# Patient Record
Sex: Female | Born: 2000 | Race: White | Hispanic: No | Marital: Single | Smoking: Never smoker
Health system: Southern US, Community
[De-identification: ages and names within clinical notes are randomized; demographics above are authoritative.]

## PROBLEM LIST (undated history)

## (undated) DIAGNOSIS — K219 Gastro-esophageal reflux disease without esophagitis: Secondary | ICD-10-CM

## (undated) HISTORY — DX: Gastro-esophageal reflux disease without esophagitis: K21.9

---
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
## 2019-06-02 DIAGNOSIS — Z20828 Contact with and (suspected) exposure to other viral communicable diseases: Secondary | ICD-10-CM | POA: Diagnosis not present

## 2019-08-31 DIAGNOSIS — Z20828 Contact with and (suspected) exposure to other viral communicable diseases: Secondary | ICD-10-CM | POA: Diagnosis not present

## 2019-09-07 DIAGNOSIS — R079 Chest pain, unspecified: Secondary | ICD-10-CM | POA: Diagnosis not present

## 2019-09-07 DIAGNOSIS — B349 Viral infection, unspecified: Secondary | ICD-10-CM | POA: Diagnosis not present

## 2019-12-07 DIAGNOSIS — Z20828 Contact with and (suspected) exposure to other viral communicable diseases: Secondary | ICD-10-CM | POA: Diagnosis not present

## 2019-12-14 DIAGNOSIS — R1013 Epigastric pain: Secondary | ICD-10-CM | POA: Diagnosis not present

## 2019-12-14 DIAGNOSIS — R6250 Unspecified lack of expected normal physiological development in childhood: Secondary | ICD-10-CM | POA: Diagnosis not present

## 2019-12-14 DIAGNOSIS — K219 Gastro-esophageal reflux disease without esophagitis: Secondary | ICD-10-CM | POA: Diagnosis not present

## 2020-03-18 DIAGNOSIS — K219 Gastro-esophageal reflux disease without esophagitis: Secondary | ICD-10-CM | POA: Diagnosis not present

## 2020-04-18 DIAGNOSIS — K219 Gastro-esophageal reflux disease without esophagitis: Secondary | ICD-10-CM | POA: Diagnosis not present

## 2020-04-19 HISTORY — PX: OTHER SURGICAL HISTORY: SHX169

## 2020-04-21 ENCOUNTER — Other Ambulatory Visit: Payer: Self-pay

## 2020-04-21 ENCOUNTER — Ambulatory Visit (INDEPENDENT_AMBULATORY_CARE_PROVIDER_SITE_OTHER): Payer: BC Managed Care – PPO | Admitting: Family Medicine

## 2020-04-21 ENCOUNTER — Encounter: Payer: Self-pay | Admitting: Family Medicine

## 2020-04-21 VITALS — BP 100/60 | HR 62 | Temp 98.0°F | Ht 64.0 in | Wt 97.2 lb

## 2020-04-21 DIAGNOSIS — Z0001 Encounter for general adult medical examination with abnormal findings: Secondary | ICD-10-CM

## 2020-04-21 DIAGNOSIS — Z23 Encounter for immunization: Secondary | ICD-10-CM

## 2020-04-21 DIAGNOSIS — M25571 Pain in right ankle and joints of right foot: Secondary | ICD-10-CM

## 2020-04-21 DIAGNOSIS — Z0000 Encounter for general adult medical examination without abnormal findings: Secondary | ICD-10-CM

## 2020-04-21 DIAGNOSIS — D229 Melanocytic nevi, unspecified: Secondary | ICD-10-CM | POA: Diagnosis not present

## 2020-04-21 DIAGNOSIS — M357 Hypermobility syndrome: Secondary | ICD-10-CM | POA: Insufficient documentation

## 2020-04-21 DIAGNOSIS — Q796 Ehlers-Danlos syndrome, unspecified: Secondary | ICD-10-CM

## 2020-04-21 DIAGNOSIS — F819 Developmental disorder of scholastic skills, unspecified: Secondary | ICD-10-CM | POA: Insufficient documentation

## 2020-04-21 LAB — CBC WITH DIFFERENTIAL/PLATELET
Basophils Absolute: 0 10*3/uL (ref 0.0–0.1)
Basophils Relative: 0.6 % (ref 0.0–3.0)
Eosinophils Absolute: 0 10*3/uL (ref 0.0–0.7)
Eosinophils Relative: 0.3 % (ref 0.0–5.0)
HCT: 41.2 % (ref 36.0–46.0)
Hemoglobin: 14.2 g/dL (ref 12.0–15.0)
Lymphocytes Relative: 20.9 % (ref 12.0–46.0)
Lymphs Abs: 1.7 10*3/uL (ref 0.7–4.0)
MCHC: 34.5 g/dL (ref 30.0–36.0)
MCV: 100.4 fl — ABNORMAL HIGH (ref 78.0–100.0)
Monocytes Absolute: 0.5 10*3/uL (ref 0.1–1.0)
Monocytes Relative: 6.8 % (ref 3.0–12.0)
Neutro Abs: 5.7 10*3/uL (ref 1.4–7.7)
Neutrophils Relative %: 71.4 % (ref 43.0–77.0)
Platelets: 310 10*3/uL (ref 150.0–400.0)
RBC: 4.1 Mil/uL (ref 3.87–5.11)
RDW: 13.3 % (ref 11.5–15.5)
WBC: 7.9 10*3/uL (ref 4.0–10.5)

## 2020-04-21 LAB — LIPID PANEL
Cholesterol: 128 mg/dL (ref 0–200)
HDL: 60.1 mg/dL (ref >39.00)
LDL Cholesterol: 53 mg/dL (ref 0–99)
NonHDL: 67.89
Total CHOL/HDL Ratio: 2
Triglycerides: 74 mg/dL (ref 0.0–149.0)
VLDL: 14.8 mg/dL (ref 0.0–40.0)

## 2020-04-21 LAB — COMPREHENSIVE METABOLIC PANEL
ALT: 8 U/L (ref 0–35)
AST: 10 U/L (ref 0–37)
Albumin: 4.8 g/dL (ref 3.5–5.2)
Alkaline Phosphatase: 59 U/L (ref 39–117)
BUN: 6 mg/dL (ref 6–23)
CO2: 30 mEq/L (ref 19–32)
Calcium: 10 mg/dL (ref 8.4–10.5)
Chloride: 101 mEq/L (ref 96–112)
Creatinine, Ser: 0.51 mg/dL (ref 0.40–1.20)
GFR: 151.86 mL/min (ref >60.00)
Glucose, Bld: 80 mg/dL (ref 70–99)
Potassium: 4.2 mEq/L (ref 3.5–5.1)
Sodium: 135 mEq/L (ref 135–145)
Total Bilirubin: 0.6 mg/dL (ref 0.2–1.2)
Total Protein: 7 g/dL (ref 6.0–8.3)

## 2020-04-21 LAB — VITAMIN D 25 HYDROXY (VIT D DEFICIENCY, FRACTURES): VITD: 25.52 ng/mL — ABNORMAL LOW (ref 30.00–100.00)

## 2020-04-21 LAB — TSH: TSH: 0.63 u[IU]/mL (ref 0.35–4.50)

## 2020-04-21 NOTE — Patient Instructions (Signed)
-I want her to get eyes checked -will likely order echo of heart, but want her to see sports med first.  -email me when you have appointment with sports med so I can send message -let GI doc know about this because can have GI symptoms associated with it as well.   Ehlers-Danlos Syndrome Ehlers-Danlos syndrome (EDS) is a group of genetic disorders that affect the body's connective tissues, which are made up of proteins. Connective tissues give support and elasticity to your skin, bones, joints, blood vessels, and internal organs. EDS causes connective tissues to become weak, fragile, and stretchable. People with EDS often have overly flexible joints and stretchy, fragile skin. There are 13 types of EDS, but many of those are rare. The most common types affect joints and skin. Other types affect the eyes, spine, or heart valves. The most severe types affect the walls of blood vessels and the structures of internal organs. What are the causes? This condition is caused by abnormal genes (genetic defects). Some types are passed down from parent to child (inherited) and others are not. Sometimes, the genes can become defective during development in the womb. What are the signs or symptoms? The signs and symptoms of EDS depend on the type. Signs and symptoms may start in childhood or later in life. Signs and symptoms of the common types include: Joint and muscle symptoms  Overly flexible joints that have a large range of motion and can dislocate easily.  Joint pain.  Weak, floppy muscles. Skin symptoms  Velvety, soft skin that stretches and sags.  Skin that bruises and tears easily.  Skin tears that are hard to repair with stitches (sutures) and heal with wide scars.  Lumps under the skin. Vision symptoms  Rupture of the eyeball or tearing of the tissue inside the eye (retinal detachment).  Changes in vision, including seeing floaters, flashes, or having sudden loss of vision. Vascular  symptoms  Bleeding from tears or ruptures of internal organs or blood vessels.  Weak heart valves causing weakening of the heart. Orthopedic symptoms  Curved spine, which can cause difficulty breathing.  Bowed limbs and short stature.  Muscle or tendon tears.  Weak, thin bones.  Club foot. Other symptoms  Collapsed lung.  Unusual facial features, such as a thin nose and lips, sunken cheeks, small ear lobes, and prominent eyes. How is this diagnosed? Your health care provider may suspect EDS based on your symptoms and medical history, especially if EDS runs in your family. Your health care provider will also do a physical exam. You may also have diagnostic tests including:  Blood tests to check for genetic defects.  Tissue tests to check for abnormal body proteins (collagen).  X-rays of the spine.  Imaging studies of internal organs (MRI or CT scan).  Bone scan.  Heart ultrasound (echocardiogram) to look for heart valve problems. How is this treated? There is no cure for this condition, but treatment can help symptoms and prevent complications. Treatment depends on the type of EDS and the symptoms it causes. Treatment options can include:  Over-the-counter pain medicines.  Vitamin C to strengthen skin.  Vitamin D and calcium to strengthen bones.  Blood pressure medicines to prevent blood vessel tears or ruptures.  Physical therapy to strengthen muscles and joints.  Assistive devices that help you move, like braces or a wheelchair, walker, or scooter.  Surgery to repair wounds, tears, joints, or ruptures. Follow these instructions at home:   Take over-the-counter and prescription medicines  only as told by your health care provider.  Return to your normal activities as told by your health care provider. Ask your health care provider what activities are safe for you. It may be important to avoid contact sports and heavy lifting.  Let your health care provider  know if you become pregnant. You will need special care to prevent rupture of the womb.  Let all health care providers know that you have EDS before any surgery or procedure.  Wear a medical alert bracelet.  Schedule eye exams often.  Consider having genetic counseling to learn if you may pass EDS on to your children. Contact a health care provider if:  You have joint pain.  You cut or tear your skin.  You have weakness or shortness of breath.  You have any new symptoms. Get help right away if you have:  Any sudden and severe pain.  Trouble breathing.  Chest pain.  Floaters or flashes in your vision or sudden loss of vision. Summary  Ehlers-Danlos syndrome (EDS) is a group of genetic disorders that affect the connective tissues of the body.  EDS is caused by abnormal genes (genetic defects).  The effects of EDS can range from having overly flexible joints and fragile skin to very serious complications, such as blood vessel or organ ruptures.  There is no cure for this condition, but treatment can relieve some symptoms and prevent complications. This information is not intended to replace advice given to you by your health care provider. Make sure you discuss any questions you have with your health care provider. Document Revised: 06/22/2019 Document Reviewed: 06/22/2019 Elsevier Patient Education  [place] 25-14 Years Old, Female Preventive care refers to lifestyle choices and visits with your health care provider that can promote health and wellness. At this stage in your life, you may start seeing a primary care physician instead of a pediatrician. Your health care is now your responsibility. Preventive care for young adults includes:  A yearly physical exam. This is also called an annual wellness visit.  Regular dental and eye exams.  Immunizations.  Screening for certain conditions.  Healthy lifestyle choices, such as diet and  exercise. What can I expect for my preventive care visit? Physical exam Your health care provider may check:  Height and weight. These may be used to calculate body mass index (BMI), which is a measurement that tells if you are at a healthy weight.  Heart rate and blood pressure.  Body temperature. Counseling Your health care provider may ask you questions about:  Past medical problems and family medical history.  Alcohol, tobacco, and drug use.  Home and relationship well-being.  Access to firearms.  Emotional well-being.  Diet, exercise, and sleep habits.  Sexual activity and sexual health.  Method of birth control.  Menstrual cycle.  Pregnancy history. What immunizations do I need?  Influenza (flu) vaccine  This is recommended every year. Tetanus, diphtheria, and pertussis (Tdap) vaccine  You may need a Td booster every 10 years. Varicella (chickenpox) vaccine  You may need this vaccine if you have not already been vaccinated. Human papillomavirus (HPV) vaccine  If recommended by your health care provider, you may need three doses over 6 months. Measles, mumps, and rubella (MMR) vaccine  You may need at least one dose of MMR. You may also need a second dose. Meningococcal conjugate (MenACWY) vaccine  One dose is recommended if you are 22-65 years old and a [employer]vertising account pl[employer]  college student living in a residence hall, or if you have one of several medical conditions. You may also need additional booster doses. Pneumococcal conjugate (PCV13) vaccine  You may need this if you have certain conditions and were not previously vaccinated. Pneumococcal polysaccharide (PPSV23) vaccine  You may need one or two doses if you smoke cigarettes or if you have certain conditions. Hepatitis A vaccine  You may need this if you have certain conditions or if you travel or work in places where you may be exposed to hepatitis A. Hepatitis B vaccine  You may need this if you have  certain conditions or if you travel or work in places where you may be exposed to hepatitis B. Haemophilus influenzae type b (Hib) vaccine  You may need this if you have certain risk factors. You may receive vaccines as individual doses or as more than one vaccine together in one shot (combination vaccines). Talk with your health care provider about the risks and benefits of combination vaccines. What tests do I need? Blood tests  Lipid and cholesterol levels. These may be checked every 5 years starting at age 78.  Hepatitis C test.  Hepatitis B test. Screening  Pelvic exam and Pap test. This may be done every 3 years starting at age 68.  Sexually transmitted disease (STD) testing, if you are at risk.  BRCA-related cancer screening. This may be done if you have a family history of breast, ovarian, tubal, or peritoneal cancers. Other tests  Tuberculosis skin test.  Vision and hearing tests.  Skin exam.  Breast exam. Follow these instructions at home: Eating and drinking   Eat a diet that includes fresh fruits and vegetables, whole grains, lean protein, and low-fat dairy products.  Drink enough fluid to keep your urine pale yellow.  Do not drink alcohol if: ? Your health care provider tells you not to drink. ? You are pregnant, may be pregnant, or are planning to become pregnant. ? You are under the legal drinking age. In the [place] the legal drinking age is 55.  If you drink alcohol: ? Limit how much you have to 0-1 drink a day. ? Be aware of how much alcohol is in your drink. In the [place] one drink equals one 12 oz bottle of beer (355 mL), one 5 oz glass of wine (148 mL), or one 1 oz glass of hard liquor (44 mL). Lifestyle  Take daily care of your teeth and gums.  Stay active. Exercise at least 30 minutes 5 or more days of the week.  Do not use any products that contain nicotine or tobacco, such as cigarettes, e-cigarettes, and chewing tobacco. If you need help  quitting, ask your health care provider.  Do not use drugs.  If you are sexually active, practice safe sex. Use a condom or other form of birth control (contraception) in order to prevent pregnancy and STIs (sexually transmitted infections). If you plan to become pregnant, see your health care provider for a pre-conception visit.  Find healthy ways to cope with stress, such as: ? Meditation, yoga, or listening to music. ? Journaling. ? Talking to a trusted person. ? Spending time with friends and family. Safety  Always wear your seat belt while driving or riding in a vehicle.  Do not drive if you have been drinking alcohol. Do not ride with someone who has been drinking.  Do not drive when you are tired or distracted. Do not text while driving.  Wear a helmet  and other protective equipment during sports activities.  If you have firearms in your house, make sure you follow all gun safety procedures.  Seek help if you have been bullied, physically abused, or sexually abused.  Use the Internet responsibly to avoid dangers such as online bullying and online sex predators. What's next?  Go to your health care provider once a year for a well check visit.  Ask your health care provider how often you should have your eyes and teeth checked.  Stay up to date on all vaccines. This information is not intended to replace advice given to you by your health care provider. Make sure you discuss any questions you have with your health care provider. Document Revised: 11/13/2018 Document Reviewed: 11/13/2018 Elsevier Patient Education  2020 [employer].

## 2020-04-21 NOTE — Progress Notes (Signed)
Patient: Kelly Warren MRN: [id number] DOB: 2001/10/05 PCP: Orma Flaming, MD     Subjective:  Chief Complaint  Patient presents with  . Establish Care    New Patient    HPI: The patient is a 19 y.o. female who presents today for annual exam. She denies any changes to past medical history. There have been no recent hospitalizations. They are not following a well balanced diet and exercise plan. Weight has been stable. She complains of right lower pain on the top of foot, where she bends. Starting 2 weeks ago. Her Mother is present in the visit, and requests Meningitis vaccine.   Pain in right ankle. Started 2 weeks ago. Denies any trauma, falls, exercise, new shoes. She states it just started hurting. Only hurts when walking. No edema/redness.   Immunization History  Administered Date(s) Administered  . Meningococcal Mcv4o 04/21/2020  . PFIZER SARS-COV-2 Vaccination 02/15/2020, 03/08/2020   Colonoscopy: routine screening Mammogram: routine screening  Pap smear: n/a   Review of Systems  Respiratory: Negative for cough, shortness of breath and wheezing.   Cardiovascular: Negative for chest pain and palpitations.  Gastrointestinal: Negative for abdominal pain, nausea and vomiting.  Genitourinary: Negative for frequency, pelvic pain and urgency.  Neurological: Negative for dizziness, light-headedness and headaches.    Allergies Patient has No Known Allergies.  Past Medical History Patient  has a past medical history of GERD (gastroesophageal reflux disease).  Surgical History Patient  has a past surgical history that includes wisdom teeth (04/19/2020).  Family History Pateint's family history includes Arthritis in her maternal grandfather, maternal grandmother, and paternal grandfather; Diabetes in her maternal grandfather and maternal grandmother; Hyperlipidemia in her maternal grandfather and maternal grandmother; Hypertension in her maternal grandfather and maternal  grandmother; Miscarriages / [place] in her sister.  Social History Patient  reports that she has never smoked. She has never used smokeless tobacco. She reports that she does not drink alcohol or use drugs.    Objective: Vitals:   04/21/20 1058  BP: 100/60  Pulse: 62  Temp: 98 F (36.7 C)  TempSrc: Temporal  SpO2: 99%  Weight: 97 lb 3.2 oz (44.1 kg)  Height: 5\' 4"  (1.626 m)    Body mass index is 16.68 kg/m.  Physical Exam Vitals reviewed.  Constitutional:      Appearance: Normal appearance. She is well-developed.     Comments: Thin with poor posture and kyphosis   HENT:     Head: Normocephalic and atraumatic.     Right Ear: Tympanic membrane, ear canal and external ear normal.     Left Ear: Tympanic membrane, ear canal and external ear normal.     Nose: Nose normal.     Mouth/Throat:     Mouth: Mucous membranes are moist.     Comments: Can not see well. Just had wisdom tooth surgery  Eyes:     Extraocular Movements: Extraocular movements intact.     Conjunctiva/sclera: Conjunctivae normal.     Pupils: Pupils are equal, round, and reactive to light.  Neck:     Thyroid: No thyromegaly.  Cardiovascular:     Rate and Rhythm: Normal rate and regular rhythm.     Pulses: Normal pulses.     Heart sounds: Normal heart sounds. No murmur.  Pulmonary:     Effort: Pulmonary effort is normal.     Breath sounds: Normal breath sounds.  Abdominal:     General: Abdomen is flat. Bowel sounds are normal. There is no distension.  Palpations: Abdomen is soft.     Tenderness: There is no abdominal tenderness.  Musculoskeletal:     Cervical back: Normal range of motion and neck supple.     Comments: Lax and hypermobile joints.   Lymphadenopathy:     Cervical: No cervical adenopathy.  Skin:    General: Skin is warm and dry.     Capillary Refill: Capillary refill takes less than 2 seconds.     Findings: No rash.  Neurological:     General: No focal deficit present.      Mental Status: She is alert and oriented to person, place, and time.     Cranial Nerves: No cranial nerve deficit.     Coordination: Coordination normal.     Deep Tendon Reflexes: Reflexes normal.  Psychiatric:        Mood and Affect: Mood normal.        Behavior: Behavior normal.      Office Visit from 04/21/2020 in [place]  PHQ-2 Total Score  0          Assessment/plan: 1. Annual physical exam Routine labs today. Meningitis shot. Due for tdap in December and they will come back for this. Needs to start exercising/working on core. Poor posture. No need for pap/hiv-not sexually active.  Patient counseling [x]    Nutrition: Stressed importance of moderation in sodium/caffeine intake, saturated fat and cholesterol, caloric balance, sufficient intake of fresh fruits, vegetables, fiber, calcium, iron, and 1 mg of folate supplement per day (for females capable of pregnancy).  [x]    Stressed the importance of regular exercise.   []    Substance Abuse: Discussed cessation/primary prevention of tobacco, alcohol, or other drug use; driving or other dangerous activities under the influence; availability of treatment for abuse.   [x]    Injury prevention: Discussed safety belts, safety helmets, smoke detector, smoking near bedding or upholstery.   [x]    Sexuality: Discussed sexually transmitted diseases, partner selection, use of condoms, avoidance of unintended pregnancy  and contraceptive alternatives.  [x]    Dental health: Discussed importance of regular tooth brushing, flossing, and dental visits.  [x]    Health maintenance and immunizations reviewed. Please refer to Health maintenance section.    - CBC with Differential/Platelet - Comprehensive metabolic panel - Lipid panel - TSH - VITAMIN D 25 Hydroxy (Vit-D Deficiency, Fractures)  2. Acute right ankle pain See below. Will have her evaluated by sports med and hopefully they can do beighton score and measure arm  span-height . Concern related to EDS. See below.  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 She is also have GI symptoms that are consistent with this.  +generalized joint hypermobility +MSK pain -Will have sports eval and score her  -referral to genetics -eye exam and discussed with mom -echo ordered  4. Atypical mole  -they have derm and will have him look at this.   This visit occurred during the SARS-CoV-2 public health emergency.  Safety protocols were in place, including screening questions prior to the visit, additional usage of staff PPE, and extensive cleaning of exam room while observing appropriate contact time as indicated for disinfecting solutions.      Return if symptoms worsen or fail to improve.     Orma Flaming, MD [place]  04/21/2020

## 2020-04-22 ENCOUNTER — Other Ambulatory Visit: Payer: Self-pay | Admitting: Family Medicine

## 2020-04-22 ENCOUNTER — Encounter: Payer: Self-pay | Admitting: Family Medicine

## 2020-04-22 DIAGNOSIS — E559 Vitamin D deficiency, unspecified: Secondary | ICD-10-CM | POA: Insufficient documentation

## 2020-04-22 MED ORDER — VITAMIN D (ERGOCALCIFEROL) 1.25 MG (50000 UNIT) PO CAPS
ORAL_CAPSULE | ORAL | 0 refills | Status: DC
Start: 2020-04-22 — End: 2024-03-30

## 2020-04-22 NOTE — Progress Notes (Signed)
Called pt's Mother to give lab results. She verbalized understanding and verified pharmacy. [facility].

## 2020-04-22 NOTE — Progress Notes (Signed)
Please see what pharmacy they prefer that I send vitamin D please! Thanks,  Dr. Rogers Blocker

## 2020-04-28 ENCOUNTER — Ambulatory Visit: Payer: Self-pay

## 2020-04-28 ENCOUNTER — Encounter: Payer: Self-pay | Admitting: Family Medicine

## 2020-04-28 ENCOUNTER — Ambulatory Visit (INDEPENDENT_AMBULATORY_CARE_PROVIDER_SITE_OTHER): Payer: BC Managed Care – PPO | Admitting: Family Medicine

## 2020-04-28 ENCOUNTER — Ambulatory Visit (INDEPENDENT_AMBULATORY_CARE_PROVIDER_SITE_OTHER): Payer: BC Managed Care – PPO

## 2020-04-28 ENCOUNTER — Other Ambulatory Visit: Payer: Self-pay

## 2020-04-28 VITALS — BP 100/58 | HR 87 | Ht 64.0 in | Wt 95.2 lb

## 2020-04-28 DIAGNOSIS — M25571 Pain in right ankle and joints of right foot: Secondary | ICD-10-CM | POA: Diagnosis not present

## 2020-04-28 DIAGNOSIS — G8929 Other chronic pain: Secondary | ICD-10-CM

## 2020-04-28 DIAGNOSIS — M5385 Other specified dorsopathies, thoracolumbar region: Secondary | ICD-10-CM | POA: Diagnosis not present

## 2020-04-28 DIAGNOSIS — M546 Pain in thoracic spine: Secondary | ICD-10-CM

## 2020-04-28 DIAGNOSIS — R29898 Other symptoms and signs involving the musculoskeletal system: Secondary | ICD-10-CM

## 2020-04-28 NOTE — Progress Notes (Signed)
Subjective:    I'm seeing this patient as a consultation for:  Dr. Rogers Blocker. Note will be routed back to referring provider/PCP.  CC: R ankle pain and generalized hypermobility  I, Molly Weber, LAT, ATC, am serving as scribe for Dr. Lynne Leader.  HPI: Pt is a 19 y/o female presenting w/ c/o R anterior ankle pain x 2 weeks w/ no MOI.  She locates her pain to her R anterior ankle and distal lower leg.  She rates her pain as mild and describes her pain as sharp and aching.  Of more concern is overall joint laxity and desire to be screened for possible EDS.    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 Gaynell Face.  She was thought to have ligamentous laxity and did pretty well with physical therapy occupational therapy and speech therapy.  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  She is working part-time currently [employer].  She is never had a formal diagnosis.  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  Echocardiogram pending and was referred to genetics.   R ankle swelling: No R ankle mechanical symptoms: No Aggravating factors: walking; R ankle DF/PF; manual pressure to the area Treatments tried: IBU, ice  Past medical history, Surgical history, Family history, Social history, Allergies, and medications have been entered into the medical record, reviewed.   Review of Systems: No new headache, visual changes, nausea, vomiting, diarrhea, constipation, dizziness, abdominal pain, skin rash, fevers, chills, night sweats, weight loss, swollen lymph nodes, body aches, joint swelling, muscle aches, chest pain, shortness of breath, mood changes, visual or auditory hallucinations.   Objective:    Vitals:   04/28/20 0922  BP: (!) 100/58  Pulse: 87  SpO2: 97%   General: Well Developed, well nourished, and in no acute  distress.  Neuro/Psych: Alert and oriented x3, extra-ocular muscles intact, able to move all 4 extremities, sensation grossly intact. Skin: Warm and dry, no rashes noted.  No significant laxity. Respiratory: Not using accessory muscles, speaking in full sentences, trachea midline.  Cardiovascular: Pulses palpable, no extremity edema. Abdomen: Does not appear distended. MSK: Spine: Thoracic kyphosis present otherwise normal-appearing nontender normal motion. Lower extremities somewhat internally rotated lower legs with knee valgus.  Foot pronation present with standing however intact heel valgus with toe standing. Abnormal gait present. Excessive bilateral ankle laxity present to talar tilt.  Normal anterior drawer test.  However normal strength and motion otherwise.  Beighton score 2/9  Lab and Radiology Results  X-ray images obtained today personally and independently reviewed.  Thoracolumbar spine: Thoracic kyphosis and lumbar lordosis present.  Scoliosis present at lumbar spine convex to left with slight rotational component.  Not much scoliosis present thoracic spine. 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 Impression: Ankle joint ultrasound examination.   Impression and Recommendations:    Assessment and Plan: 19 y.o. 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  She does not have significant ligamentous laxity on my exam today with a Beighton score of 2/9.  Erler's Danlos is somewhat less likely.  However I do think  that she probably has some other neurodevelopmental syndrome or diagnosis.  I agree with genetics referral as well  as ophthalmology referral and echocardiogram.  These are reasonable to perform.  I did however notice that she has thoracic kyphosis on exam and on x-ray has a bit of scoliosis and thoracic kyphosis.  Plan to proceed with physical therapy for that as well as her ankle instability.  Additionally will use scaphoid pads or sports insoles to improve her pronation.  Lengthy discussion with patient and mother.  Recheck with me as needed..   Orders Placed This Encounter  Procedures  . [place] LIMITED JOINT SPACE STRUCTURES LOW RIGHT(NO LINKED CHARGES)    Order Specific Question:   Reason for Exam (SYMPTOM  OR DIAGNOSIS REQUIRED)    Answer:   R ankle pain    Order Specific Question:   Preferred imaging location?    Answer:   [place]  . 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    Answer:   No    Order Specific Question:   Preferred imaging location?    Answer:   [facility]    Order Specific Question:   Radiology Contrast Protocol - do NOT remove file path    Answer:   \\[email or web address]\DXFluoroContrastProtocols.pdf  . DG Ankle Complete Right    Standing Status:   Future    Number of Occurrences:   1    Standing Expiration Date:   04/28/2021    Order Specific Question:   Reason for Exam (SYMPTOM  OR DIAGNOSIS REQUIRED)    Answer:   eval ankle pain    Order Specific Question:   Is patient pregnant?    Answer:   No    Order Specific Question:   Preferred imaging location?    Answer:   [facility]    Order Specific Question:   Radiology Contrast Protocol - do NOT remove file path    Answer:    \\[email or web address]\Radiant\DXFluoroContrastProtocols.pdf  . Ambulatory referral to Physical Therapy    Referral Priority:   Routine    Referral Type:   Physical Medicine    Referral Reason:   Specialty Services Required    Requested Specialty:   Physical Therapy   No orders of the defined types were placed in this encounter.   Discussed warning signs or symptoms. Please see discharge instructions. Patient expresses understanding.   The above documentation has been reviewed and is accurate and complete Lynne Leader, M.D.

## 2020-04-28 NOTE — Patient Instructions (Addendum)
Thank you for coming in today. Plan for xrays today.  I will get results to you today or tomorrow.  Plan for PT.  Keep me updated.  Can do more remotely.  Will also recommend good arch support.  Try Hapad scaphoid pads or aftermarket insoles.

## 2020-04-28 NOTE — Progress Notes (Signed)
Radiology saw the same thing I did.  The thoracic spine does curve a bit and there is mild scoliosis present in the lumbar spine.  Physical therapy should be helpful for for this.

## 2020-04-28 NOTE — Progress Notes (Signed)
Ankle x-ray normal per radiology

## 2020-05-09 ENCOUNTER — Ambulatory Visit (INDEPENDENT_AMBULATORY_CARE_PROVIDER_SITE_OTHER): Payer: BC Managed Care – PPO | Admitting: Physical Therapy

## 2020-05-09 ENCOUNTER — Encounter: Payer: Self-pay | Admitting: Physical Therapy

## 2020-05-09 ENCOUNTER — Other Ambulatory Visit: Payer: Self-pay

## 2020-05-09 DIAGNOSIS — M25571 Pain in right ankle and joints of right foot: Secondary | ICD-10-CM | POA: Diagnosis not present

## 2020-05-09 DIAGNOSIS — R293 Abnormal posture: Secondary | ICD-10-CM | POA: Diagnosis not present

## 2020-05-09 DIAGNOSIS — R2689 Other abnormalities of gait and mobility: Secondary | ICD-10-CM

## 2020-05-12 ENCOUNTER — Other Ambulatory Visit: Payer: Self-pay | Admitting: Gastroenterology

## 2020-05-12 DIAGNOSIS — R634 Abnormal weight loss: Secondary | ICD-10-CM | POA: Diagnosis not present

## 2020-05-12 DIAGNOSIS — R110 Nausea: Secondary | ICD-10-CM

## 2020-05-12 DIAGNOSIS — R1013 Epigastric pain: Secondary | ICD-10-CM | POA: Diagnosis not present

## 2020-05-12 DIAGNOSIS — Z20822 Contact with and (suspected) exposure to covid-19: Secondary | ICD-10-CM | POA: Diagnosis not present

## 2020-05-12 DIAGNOSIS — K219 Gastro-esophageal reflux disease without esophagitis: Secondary | ICD-10-CM | POA: Diagnosis not present

## 2020-05-13 ENCOUNTER — Encounter: Payer: Self-pay | Admitting: Family Medicine

## 2020-05-13 ENCOUNTER — Other Ambulatory Visit: Payer: Self-pay | Admitting: Family Medicine

## 2020-05-13 DIAGNOSIS — Q796 Ehlers-Danlos syndrome, unspecified: Secondary | ICD-10-CM

## 2020-05-16 ENCOUNTER — Encounter: Payer: BC Managed Care – PPO | Admitting: Physical Therapy

## 2020-05-16 ENCOUNTER — Encounter: Payer: Self-pay | Admitting: Physical Therapy

## 2020-05-16 NOTE — Therapy (Signed)
[facility] [address] [place], [place], [address] Phone: [phone]   Fax:  [phone]  Physical Therapy Evaluation  Patient Details  Name: Kelly Warren MRN: [id number] Date of Birth: Dec 22, 2000 Referring Provider (PT): Lynne Leader   Encounter Date: 05/09/2020   PT End of Session - 05/16/20 1504    Visit Number 1    Number of Visits 12    Date for PT Re-Evaluation 06/20/20    Authorization Type BCBS    PT Start Time 0930    PT Stop Time 1010    PT Time Calculation (min) 40 min    Activity Tolerance Patient tolerated treatment well    Behavior During Therapy [facility] for tasks assessed/performed           Past Medical History:  Diagnosis Date  . GERD (gastroesophageal reflux disease)     Past Surgical History:  Procedure Laterality Date  . wisdom teeth  04/19/2020    There were no vitals filed for this visit.    Subjective Assessment - 05/16/20 1443    Subjective Pt (19) accompanied by her mom for appt today. Mom assists with more detailed history than pt gives. Pt states she is leaving for college in [place] at end of August. Pt states onse of R foot pain, no incident to report, pain has been mild, but bothersome for about 2 weeks, increased pain when up on her feet. Mom states pt was late walker, and had PT as a baby/toddler. Pt also has other GERD issues that she is seeing MD for in next few weeks.Mom is concerned about pts posture, though pt has no pain in back. Pt states she does not like to play sports, ride bike, mom states general coordination not great, (sister is an athlete).    Limitations Standing;Walking;House hold activities    Patient Stated Goals decreased pain in foot    Currently in Pain? Yes    Pain Score 4     Pain Location Ankle    Pain Orientation Right    Pain Descriptors / Indicators Aching    Pain Type Acute pain    Pain Onset 1 to 4 weeks ago    Pain Frequency Intermittent    Aggravating Factors   standing, walking              [facility] PT Assessment - 05/16/20 0001      Assessment   Medical Diagnosis R ankle pain    Referring Provider (PT) Lynne Leader    Prior Therapy no      Balance Screen   Has the patient fallen in the past 6 months No      Prior Function   Level of Independence Independent      Cognition   Overall Cognitive Status Within Functional Limits for tasks assessed    Behaviors --   Pt with decreased attention and eye contact     Coordination   Gross Motor Movements are Fluid and Coordinated No      Posture/Postural Control   Posture Comments for age, pt with increased fwd head, rounded shoulders and thoraicc kyphosis       ROM / Strength   AROM / PROM / Strength AROM;Strength      AROM   Overall AROM Comments UE: WNL mild hypermobility,  LE: WNL, Ankles hypermobile.       Strength   Overall Strength Comments UE: 4-/5,  Knee: 4/5, hips: 4-/5, Anke: 4-/5  Palpation   Palpation comment Pain in anterior ankle, at extensor digitorum and retinaculum, mild into R anterior tib, hypermobile and weak ankles bil        Special Tests   Other special tests SLS: 3-5  sec bil, significant sway                       Objective measurements completed on examination: See above findings.               PT Education - 05/16/20 1504    Education Details PT POC, Exam findings    Person(s) Educated Patient    Methods Explanation;Demonstration;Tactile cues;Handout;Verbal cues    Comprehension Returned demonstration;Verbalized understanding;Verbal cues required;Tactile cues required;Need further instruction            PT Short Term Goals - 05/16/20 1524      PT SHORT TERM GOAL #1   Title Pt to be independent with initial HEP    Time 2    Period Weeks    Status New    Target Date 05/23/20             PT Long Term Goals - 05/16/20 1507      PT LONG TERM GOAL #1   Title Pt to be independent with final HEP for ankle, and  posture    Time 6    Period Weeks    Status New    Target Date 06/20/20      PT LONG TERM GOAL #2   Title Pt to demo ability to self correct posture in clinic, and demo optimal seated and standing posture for cervical, thoracic, and lumbar spine.    Time 6    Period Weeks    Status New    Target Date 06/20/20      PT LONG TERM GOAL #3   Title Pt to report decreased pain in R foot/ankle to 0-1/10 with standing/walking at least 30 min    Time 6    Period Weeks    Status New    Target Date 06/20/20      PT LONG TERM GOAL #4   Title Pt to demo improved stability of bil ankle to perform SLS for at least 30 sec, to improve balance and stability    Time 6    Status New    Target Date 06/20/20                  Plan - 05/16/20 1510    Clinical Impression Statement Pt presents with primary complaint of increased pain in R foot/ankle. Pain mostly at extensor digitorum at distal ankle and top of foot. Pt with hypermobility of bil ankles, but not all joints. Pt does appear to have decreased coordination with gross motor tasks, and poor ability for balance and SLS. She has mild/mod gait deficits, that seem more neurologic vs being antalgic from ankle pain. She also has poor seated and standing posture, with very rounded shoulders, forward head and thoracic kyphosis. Will discuss referral with MD to Dr Oneida Alar for possible ruling in or out of Elhers Danlos vs needing to send to Neuro for other underlying motor and coordination issues.    Examination-Activity Limitations Stairs;Stand    Examination-Participation Restrictions Community Activity    Stability/Clinical Decision Making Evolving/Moderate complexity    Clinical Decision Making Moderate    Rehab Potential Good    PT Frequency 2x / week    PT Duration 6 weeks  PT Treatment/Interventions ADLs/Self Care Home Management;Cryotherapy;Electrical Stimulation;Gait training;Ultrasound;Traction;Moist Heat;Iontophoresis 4mg /ml  Dexamethasone;Stair training;Functional mobility training;Therapeutic activities;Therapeutic exercise;Balance training;Neuromuscular re-education;Manual techniques;Patient/family education;Orthotic Fit/Training;Passive range of motion;Dry needling;Splinting;Taping;Vasopneumatic Device;Joint Manipulations    Consulted and Agree with Plan of Care Patient           Patient will benefit from skilled therapeutic intervention in order to improve the following deficits and impairments:  Abnormal gait, Decreased coordination, Difficulty walking, Decreased activity tolerance, Pain, Hypermobility, Improper body mechanics, Postural dysfunction, Decreased balance, Decreased mobility, Decreased strength  Visit Diagnosis: Other abnormalities of gait and mobility  Pain in right ankle and joints of right foot  Abnormal posture     Problem List Patient Active Problem List   Diagnosis Date Noted  . Vitamin D deficiency 04/22/2020  . Learning disability 04/21/2020  . Ehlers-Danlos syndrome 04/21/2020    Lyndee Hensen, PT, DPT 3:30 PM  05/16/20    [facility] [place], [place], [address] Phone: [phone]   Fax:  [phone]  Name: Kelly Warren MRN: [id number] Date of Birth: 2001-10-26

## 2020-05-17 ENCOUNTER — Ambulatory Visit: Payer: BC Managed Care – PPO | Admitting: Family Medicine

## 2020-05-17 DIAGNOSIS — R12 Heartburn: Secondary | ICD-10-CM | POA: Diagnosis not present

## 2020-05-17 DIAGNOSIS — R634 Abnormal weight loss: Secondary | ICD-10-CM | POA: Diagnosis not present

## 2020-05-17 DIAGNOSIS — K2930 Chronic superficial gastritis without bleeding: Secondary | ICD-10-CM | POA: Diagnosis not present

## 2020-05-17 DIAGNOSIS — K228 Other specified diseases of esophagus: Secondary | ICD-10-CM | POA: Diagnosis not present

## 2020-05-17 DIAGNOSIS — R1010 Upper abdominal pain, unspecified: Secondary | ICD-10-CM | POA: Diagnosis not present

## 2020-05-18 DIAGNOSIS — D225 Melanocytic nevi of trunk: Secondary | ICD-10-CM | POA: Diagnosis not present

## 2020-05-18 DIAGNOSIS — D485 Neoplasm of uncertain behavior of skin: Secondary | ICD-10-CM | POA: Diagnosis not present

## 2020-05-18 DIAGNOSIS — L578 Other skin changes due to chronic exposure to nonionizing radiation: Secondary | ICD-10-CM | POA: Diagnosis not present

## 2020-05-24 ENCOUNTER — Ambulatory Visit (INDEPENDENT_AMBULATORY_CARE_PROVIDER_SITE_OTHER): Payer: BC Managed Care – PPO | Admitting: Physical Therapy

## 2020-05-24 ENCOUNTER — Other Ambulatory Visit: Payer: Self-pay

## 2020-05-24 DIAGNOSIS — R2689 Other abnormalities of gait and mobility: Secondary | ICD-10-CM | POA: Diagnosis not present

## 2020-05-24 DIAGNOSIS — M25571 Pain in right ankle and joints of right foot: Secondary | ICD-10-CM

## 2020-05-24 DIAGNOSIS — R293 Abnormal posture: Secondary | ICD-10-CM

## 2020-05-25 ENCOUNTER — Encounter: Payer: Self-pay | Admitting: Physical Therapy

## 2020-05-25 NOTE — Therapy (Signed)
[facility] [address] [place], [place], [address] Phone: [phone]   Fax:  [phone]  Physical Therapy Treatment  Patient Details  Name: Menaal Russum MRN: [id number] Date of Birth: Jan 17, 2001 Referring Provider (PT): Lynne Leader   Encounter Date: 05/24/2020   PT End of Session - 05/25/20 1204    Visit Number 2    Number of Visits 12    Date for PT Re-Evaluation 06/20/20    Authorization Type BCBS    PT Start Time 1520    PT Stop Time 1600    PT Time Calculation (min) 40 min    Activity Tolerance Patient tolerated treatment well    Behavior During Therapy [facility] for tasks assessed/performed           Past Medical History:  Diagnosis Date  . GERD (gastroesophageal reflux disease)     Past Surgical History:  Procedure Laterality Date  . wisdom teeth  04/19/2020    There were no vitals filed for this visit.   Subjective Assessment - 05/25/20 1203    Subjective Pt states soreness in anterior ankle.    Currently in Pain? Yes    Pain Score 3     Pain Location Ankle    Pain Orientation Right    Pain Descriptors / Indicators Aching    Pain Type Acute pain    Pain Onset 1 to 4 weeks ago    Pain Frequency Intermittent                             [facility] Adult PT Treatment/Exercise - 05/25/20 0001      Exercises   Exercises Knee/Hip      Knee/Hip Exercises: Standing   Heel Raises 20 reps    Hip Flexion 20 reps    Hip Abduction 20 reps;Both    SLS 30 sec x 3 bil;     Other Standing Knee Exercises Tandem stance 30 sec x 2     Other Standing Knee Exercises Rows GTB x 20;  BIl shoulder ER x20 RTB; Wall push ups x20 ;       Knee/Hip Exercises: Supine   Bridges with Clamshell 20 reps    Straight Leg Raises 2 sets;10 reps;Both    Other Supine Knee/Hip Exercises Clams RTB x 20;       Manual Therapy   Manual Therapy Joint mobilization;Soft tissue mobilization    Joint Mobilization PA mobs for ankle  DF    Soft tissue mobilization DTM/IASTM to anterior ankle, dorsum of foot, and into distal tibia                    PT Short Term Goals - 05/16/20 1524      PT SHORT TERM GOAL #1   Title Pt to be independent with initial HEP    Time 2    Period Weeks    Status New    Target Date 05/23/20             PT Long Term Goals - 05/16/20 1507      PT LONG TERM GOAL #1   Title Pt to be independent with final HEP for ankle, and posture    Time 6    Period Weeks    Status New    Target Date 06/20/20      PT LONG TERM GOAL #2   Title Pt to demo ability to self correct posture  in clinic, and demo optimal seated and standing posture for cervical, thoracic, and lumbar spine.    Time 6    Period Weeks    Status New    Target Date 06/20/20      PT LONG TERM GOAL #3   Title Pt to report decreased pain in R foot/ankle to 0-1/10 with standing/walking at least 30 min    Time 6    Period Weeks    Status New    Target Date 06/20/20      PT LONG TERM GOAL #4   Title Pt to demo improved stability of bil ankle to perform SLS for at least 30 sec, to improve balance and stability    Time 6    Status New    Target Date 06/20/20                 Plan - 05/25/20 1205    Clinical Impression Statement Pt with soreness at anterior ankle with palpation and DTM. SHe does have noted increased anterior COG and early heel rise with gait that may also be contributing. Pt progressed with strengthening for upper, lower body and core. Pt with difficulty with most exercises due to coordination and muscle weakness, and has difficulty performing with correct mechanics. Plan to progress as tolerated.    Examination-Activity Limitations Stairs;Stand    Examination-Participation Restrictions Community Activity    Stability/Clinical Decision Making Evolving/Moderate complexity    Rehab Potential Good    PT Frequency 2x / week    PT Duration 6 weeks    PT Treatment/Interventions ADLs/Self Care  Home Management;Cryotherapy;Electrical Stimulation;Gait training;Ultrasound;Traction;Moist Heat;Iontophoresis 4mg /ml Dexamethasone;Stair training;Functional mobility training;Therapeutic activities;Therapeutic exercise;Balance training;Neuromuscular re-education;Manual techniques;Patient/family education;Orthotic Fit/Training;Passive range of motion;Dry needling;Splinting;Taping;Vasopneumatic Device;Joint Manipulations    Consulted and Agree with Plan of Care Patient           Patient will benefit from skilled therapeutic intervention in order to improve the following deficits and impairments:  Abnormal gait, Decreased coordination, Difficulty walking, Decreased activity tolerance, Pain, Hypermobility, Improper body mechanics, Postural dysfunction, Decreased balance, Decreased mobility, Decreased strength  Visit Diagnosis: Pain in right ankle and joints of right foot  Other abnormalities of gait and mobility  Abnormal posture     Problem List Patient Active Problem List   Diagnosis Date Noted  . Vitamin D deficiency 04/22/2020  . Learning disability 04/21/2020  . Ehlers-Danlos syndrome 04/21/2020    Lyndee Hensen, PT, DPT 12:12 PM  05/25/20    [facility] [place], [place], [address] Phone: [phone]   Fax:  [phone]  Name: Denasia Venn MRN: [id number] Date of Birth: [birth year]

## 2020-05-30 ENCOUNTER — Ambulatory Visit
Admission: RE | Admit: 2020-05-30 | Discharge: 2020-05-30 | Disposition: A | Discharge status: Home / Self Care | Payer: BC Managed Care – PPO | Source: Ambulatory Visit | Attending: Gastroenterology | Admitting: Gastroenterology

## 2020-05-30 DIAGNOSIS — R1013 Epigastric pain: Secondary | ICD-10-CM

## 2020-05-30 DIAGNOSIS — R110 Nausea: Secondary | ICD-10-CM

## 2020-05-30 DIAGNOSIS — R634 Abnormal weight loss: Secondary | ICD-10-CM

## 2020-05-31 ENCOUNTER — Ambulatory Visit (INDEPENDENT_AMBULATORY_CARE_PROVIDER_SITE_OTHER): Payer: BC Managed Care – PPO | Admitting: Physical Therapy

## 2020-05-31 ENCOUNTER — Encounter: Payer: Self-pay | Admitting: Physical Therapy

## 2020-05-31 ENCOUNTER — Other Ambulatory Visit: Payer: Self-pay

## 2020-05-31 DIAGNOSIS — M25571 Pain in right ankle and joints of right foot: Secondary | ICD-10-CM

## 2020-05-31 DIAGNOSIS — R2689 Other abnormalities of gait and mobility: Secondary | ICD-10-CM | POA: Diagnosis not present

## 2020-05-31 DIAGNOSIS — R293 Abnormal posture: Secondary | ICD-10-CM

## 2020-05-31 NOTE — Therapy (Signed)
[facility] [address] [place], [place], [address] Phone: [phone]   Fax:  [phone]  Physical Therapy Treatment  Patient Details  Name: Kelly Warren MRN: [id number] Date of Birth: [birth year] Referring Provider (PT): Lynne Leader   Encounter Date: 05/31/2020   PT End of Session - 05/31/20 1624    Visit Number 3    Number of Visits 12    Date for PT Re-Evaluation 06/20/20    Authorization Type BCBS    PT Start Time 1520    PT Stop Time 1600    PT Time Calculation (min) 40 min    Activity Tolerance Patient tolerated treatment well    Behavior During Therapy [facility] for tasks assessed/performed           Past Medical History:  Diagnosis Date  . GERD (gastroesophageal reflux disease)     Past Surgical History:  Procedure Laterality Date  . wisdom teeth  04/19/2020    There were no vitals filed for this visit.   Subjective Assessment - 05/31/20 1623    Subjective Pt states decreasing ankle pain. DId obtain new shoes and orthotics , wearing with good fit and comfort.    Currently in Pain? Yes    Pain Location Ankle    Pain Orientation Right    Pain Descriptors / Indicators Aching    Pain Type Acute pain    Pain Onset 1 to 4 weeks ago    Pain Frequency Intermittent                             [facility] Adult PT Treatment/Exercise - 05/31/20 0001      Knee/Hip Exercises: Aerobic   Recumbent Bike L1 x 6 min;       Knee/Hip Exercises: Standing   Heel Raises 20 reps    SLS 30 sec x 3 bil;     Other Standing Knee Exercises Tandem stance 30 sec x 2 ; Squats x20 with education on form and posture.     Other Standing Knee Exercises Rows GTB x 20;   Wall push ups x20 ;       Knee/Hip Exercises: Seated   Long Arc Quad Both;20 reps      Knee/Hip Exercises: Supine   Bridges with Clamshell 20 reps    Straight Leg Raises 2 sets;10 reps;Both    Other Supine Knee/Hip Exercises Clams GTB x 20;     Other Supine  Knee/Hip Exercises Supine march w GTb x 20;                     PT Short Term Goals - 05/31/20 1624      PT SHORT TERM GOAL #1   Title Pt to be independent with initial HEP    Time 2    Period Weeks    Status Achieved    Target Date 05/23/20             PT Long Term Goals - 05/16/20 1507      PT LONG TERM GOAL #1   Title Pt to be independent with final HEP for ankle, and posture    Time 6    Period Weeks    Status New    Target Date 06/20/20      PT LONG TERM GOAL #2   Title Pt to demo ability to self correct posture in clinic, and demo optimal seated and standing posture for  cervical, thoracic, and lumbar spine.    Time 6    Period Weeks    Status New    Target Date 06/20/20      PT LONG TERM GOAL #3   Title Pt to report decreased pain in R foot/ankle to 0-1/10 with standing/walking at least 30 min    Time 6    Period Weeks    Status New    Target Date 06/20/20      PT LONG TERM GOAL #4   Title Pt to demo improved stability of bil ankle to perform SLS for at least 30 sec, to improve balance and stability    Time 6    Status New    Target Date 06/20/20                 Plan - 05/31/20 1625    Clinical Impression Statement Pt with good foot/ankle alignment with new shoes and orthotics. Pt cued with gait for full step and heel strike, pt with tendency for mid foot contact and decreased swing with new shoes that may have more cushioning and tread than previous shoes. Pt with much difficulty with all strengthening today, req max cueing for mechanics. Pt unable to perform SLR without significant hip IR. HEP updated. Plan to progress as tolerated.    Examination-Activity Limitations Stairs;Stand    Examination-Participation Restrictions Community Activity    Stability/Clinical Decision Making Evolving/Moderate complexity    Rehab Potential Good    PT Frequency 2x / week    PT Duration 6 weeks    PT Treatment/Interventions ADLs/Self Care Home  Management;Cryotherapy;Electrical Stimulation;Gait training;Ultrasound;Traction;Moist Heat;Iontophoresis 4mg /ml Dexamethasone;Stair training;Functional mobility training;Therapeutic activities;Therapeutic exercise;Balance training;Neuromuscular re-education;Manual techniques;Patient/family education;Orthotic Fit/Training;Passive range of motion;Dry needling;Splinting;Taping;Vasopneumatic Device;Joint Manipulations    Consulted and Agree with Plan of Care Patient           Patient will benefit from skilled therapeutic intervention in order to improve the following deficits and impairments:  Abnormal gait, Decreased coordination, Difficulty walking, Decreased activity tolerance, Pain, Hypermobility, Improper body mechanics, Postural dysfunction, Decreased balance, Decreased mobility, Decreased strength  Visit Diagnosis: Pain in right ankle and joints of right foot  Other abnormalities of gait and mobility  Abnormal posture     Problem List Patient Active Problem List   Diagnosis Date Noted  . Vitamin D deficiency 04/22/2020  . Learning disability 04/21/2020  . Ehlers-Danlos syndrome 04/21/2020    Lyndee Hensen, PT, DPT 4:27 PM  05/31/20    [facility] [place], [place], [address] Phone: [phone]   Fax:  [phone]  Name: Kelly Warren MRN: [id number] Date of Birth: 06/30/2001

## 2020-06-02 ENCOUNTER — Other Ambulatory Visit: Payer: Self-pay

## 2020-06-02 ENCOUNTER — Ambulatory Visit (INDEPENDENT_AMBULATORY_CARE_PROVIDER_SITE_OTHER): Payer: BC Managed Care – PPO | Admitting: Sports Medicine

## 2020-06-02 DIAGNOSIS — M357 Hypermobility syndrome: Secondary | ICD-10-CM

## 2020-06-02 DIAGNOSIS — M419 Scoliosis, unspecified: Secondary | ICD-10-CM | POA: Insufficient documentation

## 2020-06-02 DIAGNOSIS — M4120 Other idiopathic scoliosis, site unspecified: Secondary | ICD-10-CM | POA: Diagnosis not present

## 2020-06-02 NOTE — Assessment & Plan Note (Signed)
This is enough change that I would recommend trying to develop HEP to lessen long term soft tissue changes Given Postural stretches - I and T Given rowing motion Given T rotations Given T lateral bending  Must work on posture and we discussed lessening head forward position and protraction of shoulders

## 2020-06-02 NOTE — Patient Instructions (Signed)
It was great meeting you today!  Please do the exercises and stretches you were given today.  Your diagnosis is kyphoscoliosis. We did not see any evidence of Ehlers Danlos Syndrome.  Call us if you have any questions!

## 2020-06-02 NOTE — Assessment & Plan Note (Signed)
On my evaluation she has some hypermobility but not the pattern seen in EDS More localized with both elbows affected She does have some laxity of shoulders and ligamentous breakdown of feet  With Beighton score < 5 and no clear associated EDS features, I am not sure any genetic testing will identify a specific cause  She will need long term orthotic or arch support for feet If she does at some point have subluxations we would recommend specific HEP

## 2020-06-02 NOTE — Progress Notes (Signed)
CC: opinion on Hypermobility and EDS  Patient comes for opinion She is planning on going to school in [place] Mother wanted to be sure she did not have significant problems with EDS  She had extensive evaluation by Dr. Lynne Leader Key notes: Hx of developmental delay Learning issues Documented kyphoscoliosis most prominent in thoracic area (XR confirmed) F0ot and ankle ligamentous laxity -note arch pads and an OTC orthotic have helped Not likely EDS with Beighton score 2/9  Mother brought for second opinion as she is concerned as daughter will be in a special learning school in [place] next year and not close by for evaluation  ROS Currently denies joint pain No HX of subluxations or dislocations Foot and ankle pain improved with arch support No unusual skin changes Denies dizziness with standing Possible ocassional migraine  PE Patient appears shy/ sits with head forward and shoulders protracted BP (!) 98/56   Ht 5\' 4"  (1.626 m)   Wt 97 lb (44 kg)   BMI 16.65 kg/m   Beighton score 2 /9 with hyperextension of both elbows Skin is moderately lax but not abnormal No KP Shoulders show slight increase in mobility Kyphoscoliosis as noted before Long. Arch collapsed and pronated w splaying toes 1/2 Strength testing showed good strength hips and shoulders  Standing to lying pulse - 70 to 80 with no siginficant change

## 2020-06-03 ENCOUNTER — Encounter: Payer: Self-pay | Admitting: Family Medicine

## 2020-06-07 ENCOUNTER — Other Ambulatory Visit: Payer: Self-pay

## 2020-06-07 ENCOUNTER — Encounter: Payer: Self-pay | Admitting: Physical Therapy

## 2020-06-07 ENCOUNTER — Ambulatory Visit (INDEPENDENT_AMBULATORY_CARE_PROVIDER_SITE_OTHER): Payer: BC Managed Care – PPO | Admitting: Physical Therapy

## 2020-06-07 DIAGNOSIS — R2689 Other abnormalities of gait and mobility: Secondary | ICD-10-CM | POA: Diagnosis not present

## 2020-06-07 DIAGNOSIS — M25571 Pain in right ankle and joints of right foot: Secondary | ICD-10-CM

## 2020-06-07 DIAGNOSIS — R293 Abnormal posture: Secondary | ICD-10-CM | POA: Diagnosis not present

## 2020-06-07 NOTE — Therapy (Addendum)
[facility] [address] [place], [place], [address] Phone: [phone]   Fax:  [phone]  Physical Therapy Treatment  Patient Details  Name: Kelly Warren MRN: [id number] Date of Birth: 2001-11-03 Referring Provider (PT): Lynne Leader   Encounter Date: 06/07/2020   PT End of Session - 06/07/20 1711     Visit Number 4    Number of Visits 12    Date for PT Re-Evaluation 06/20/20    Authorization Type BCBS    PT Start Time 1610    PT Stop Time 1650    PT Time Calculation (min) 40 min    Activity Tolerance Patient tolerated treatment well    Behavior During Therapy [facility] for tasks assessed/performed             Past Medical History:  Diagnosis Date   GERD (gastroesophageal reflux disease)     Past Surgical History:  Procedure Laterality Date   wisdom teeth  04/19/2020    There were no vitals filed for this visit.   Subjective Assessment - 06/07/20 1710     Subjective No new complaints. States ankle pain improving.    Currently in Pain? No/denies                               [facility] Adult PT Treatment/Exercise - 06/07/20 1619       Knee/Hip Exercises: Aerobic   Recumbent Bike L1 x 8 min;       Knee/Hip Exercises: Standing   Heel Raises 20 reps    Hip Flexion 20 reps    Hip Abduction 20 reps;Both    SLS 30 sec x 3 bil;     Other Standing Knee Exercises  Squats x20 with education on form and posture.     Other Standing Knee Exercises Rows GTB x 20;   Wall angles x15,       Knee/Hip Exercises: Seated   Long Arc Quad --      Knee/Hip Exercises: Supine   Bridges with Clamshell --    Straight Leg Raises --    Other Supine Knee/Hip Exercises --    Other Supine Knee/Hip Exercises TA with UE flexion x 10;  Pec stretch 30 sec x 3 bil (reviewed HEP given by MD at last appt per pt request )                       PT Short Term Goals - 05/31/20 1624       PT SHORT TERM GOAL #1    Title Pt to be independent with initial HEP    Time 2    Period Weeks    Status Achieved    Target Date 05/23/20               PT Long Term Goals - 05/16/20 1507       PT LONG TERM GOAL #1   Title Pt to be independent with final HEP for ankle, and posture    Time 6    Period Weeks    Status New    Target Date 06/20/20      PT LONG TERM GOAL #2   Title Pt to demo ability to self correct posture in clinic, and demo optimal seated and standing posture for cervical, thoracic, and lumbar spine.    Time 6    Period Weeks    Status New  Target Date 06/20/20      PT LONG TERM GOAL #3   Title Pt to report decreased pain in R foot/ankle to 0-1/10 with standing/walking at least 30 min    Time 6    Period Weeks    Status New    Target Date 06/20/20      PT LONG TERM GOAL #4   Title Pt to demo improved stability of bil ankle to perform SLS for at least 30 sec, to improve balance and stability    Time 6    Status New    Target Date 06/20/20                   Plan - 06/07/20 1712     Clinical Impression Statement Pt educated on posture today, reviewed HEP given by MD at recent appt. Pt with much difficulty with all ther ex, and req max cuing for posture and form and correct performance. Pt to benefit from contiued strengthening and education on posture.    Examination-Activity Limitations Stairs;Stand    Examination-Participation Restrictions Community Activity    Stability/Clinical Decision Making Evolving/Moderate complexity    Rehab Potential Good    PT Frequency 2x / week    PT Duration 6 weeks    PT Treatment/Interventions ADLs/Self Care Home Management;Cryotherapy;Electrical Stimulation;Gait training;Ultrasound;Traction;Moist Heat;Iontophoresis 70m/ml Dexamethasone;Stair training;Functional mobility training;Therapeutic activities;Therapeutic exercise;Balance training;Neuromuscular re-education;Manual techniques;Patient/family education;Orthotic  Fit/Training;Passive range of motion;Dry needling;Splinting;Taping;Vasopneumatic Device;Joint Manipulations    Consulted and Agree with Plan of Care Patient             Patient will benefit from skilled therapeutic intervention in order to improve the following deficits and impairments:  Abnormal gait, Decreased coordination, Difficulty walking, Decreased activity tolerance, Pain, Hypermobility, Improper body mechanics, Postural dysfunction, Decreased balance, Decreased mobility, Decreased strength  Visit Diagnosis: Pain in right ankle and joints of right foot  Other abnormalities of gait and mobility  Abnormal posture     Problem List Patient Active Problem List   Diagnosis Date Noted   Scoliosis (and kyphoscoliosis), idiopathic 06/02/2020   Vitamin D deficiency 04/22/2020   Learning disability 04/21/2020   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  Patient goals were partially met. Patient is being discharged due to - not returning since last visit.   LLyndee Hensen PT, DPT 10:16 AM  10/19/22

## 2020-06-08 DIAGNOSIS — R110 Nausea: Secondary | ICD-10-CM | POA: Diagnosis not present

## 2020-06-08 DIAGNOSIS — K2930 Chronic superficial gastritis without bleeding: Secondary | ICD-10-CM | POA: Diagnosis not present

## 2020-06-16 ENCOUNTER — Encounter: Payer: Self-pay | Admitting: Family Medicine

## 2020-07-05 ENCOUNTER — Encounter: Payer: BC Managed Care – PPO | Admitting: Physical Therapy

## 2020-07-12 ENCOUNTER — Encounter: Payer: BC Managed Care – PPO | Admitting: Physical Therapy

## 2021-06-04 IMAGING — DX DG ANKLE COMPLETE 3+V*R*
3 series · 3 of 3 positions shown · non-contrast
Comparison: None.

CLINICAL DATA: Right ankle pain anteriorly 3 weeks.  No injury.

EXAM:
RIGHT ANKLE - COMPLETE 3+ VIEW

[ankle ap]
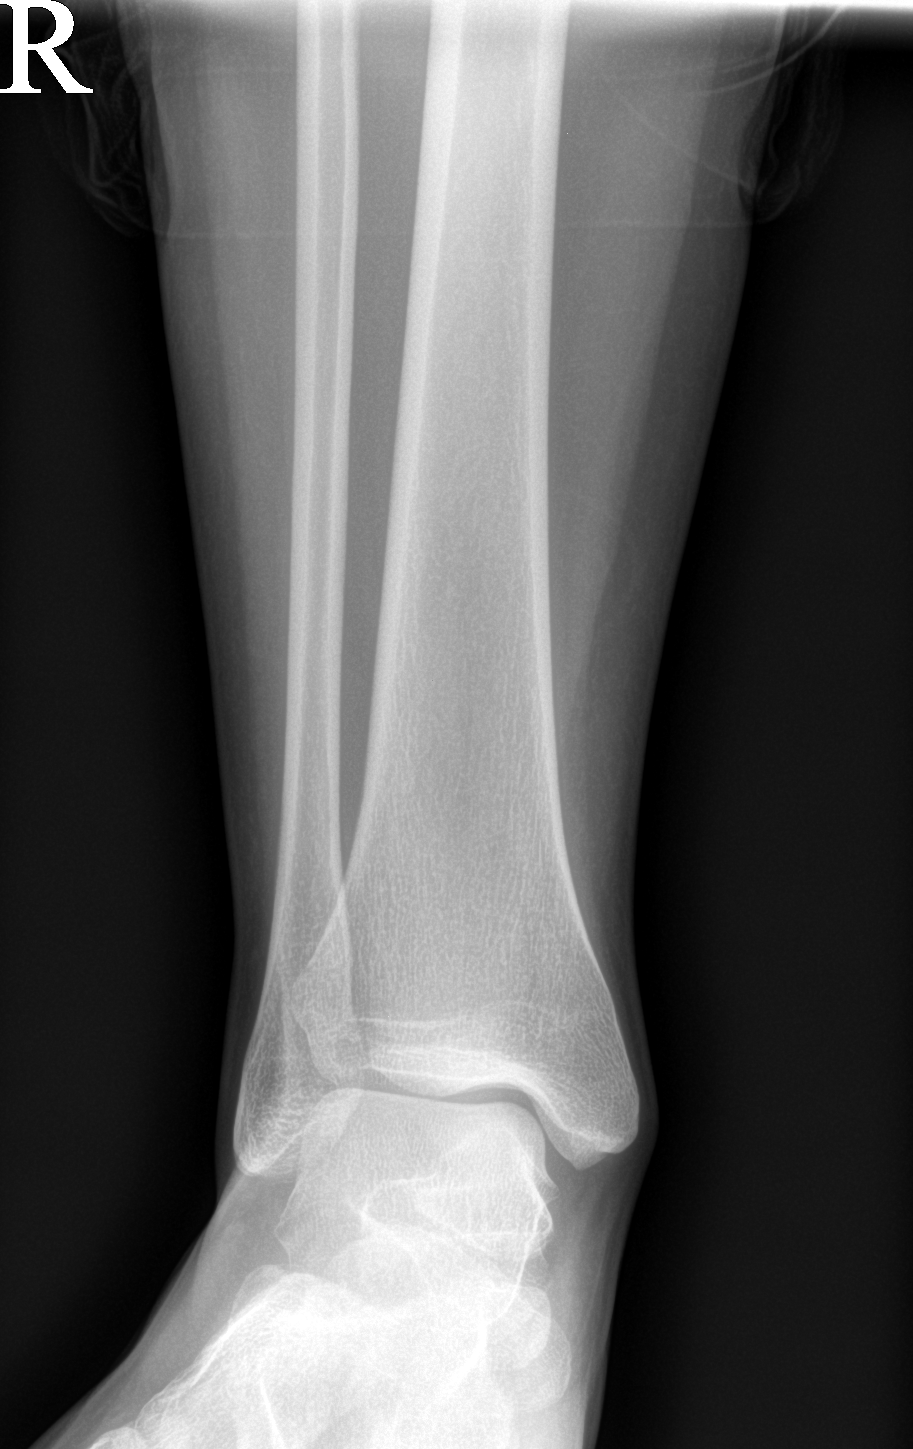

[ankle obl]
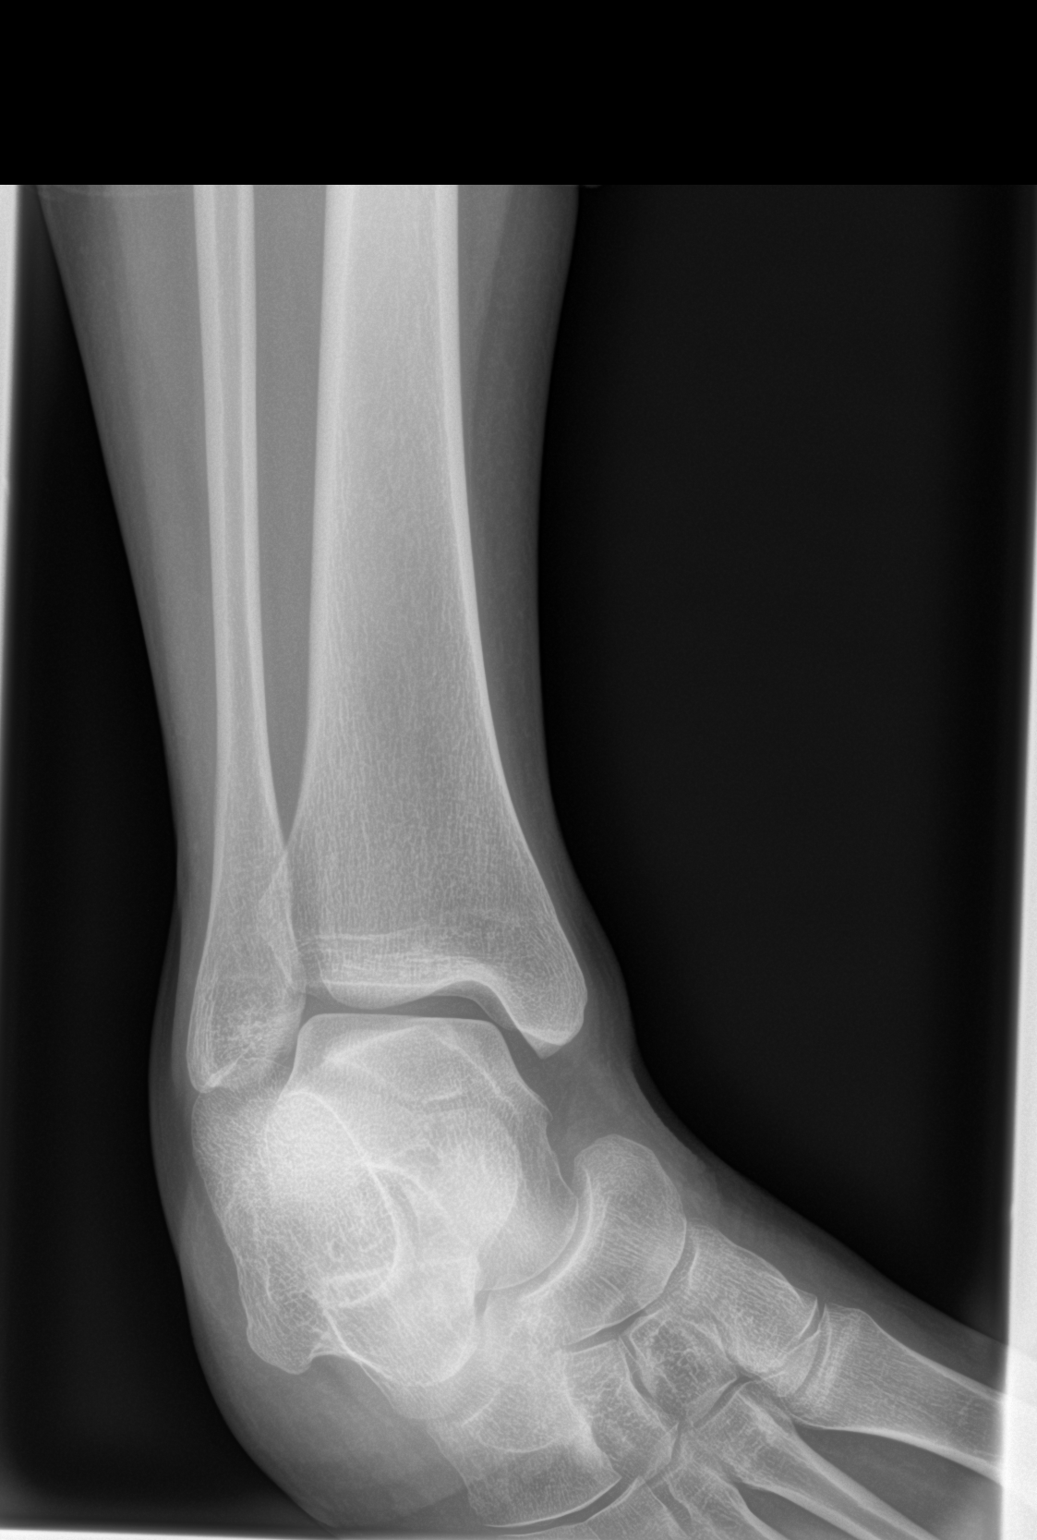

[ankle lat]
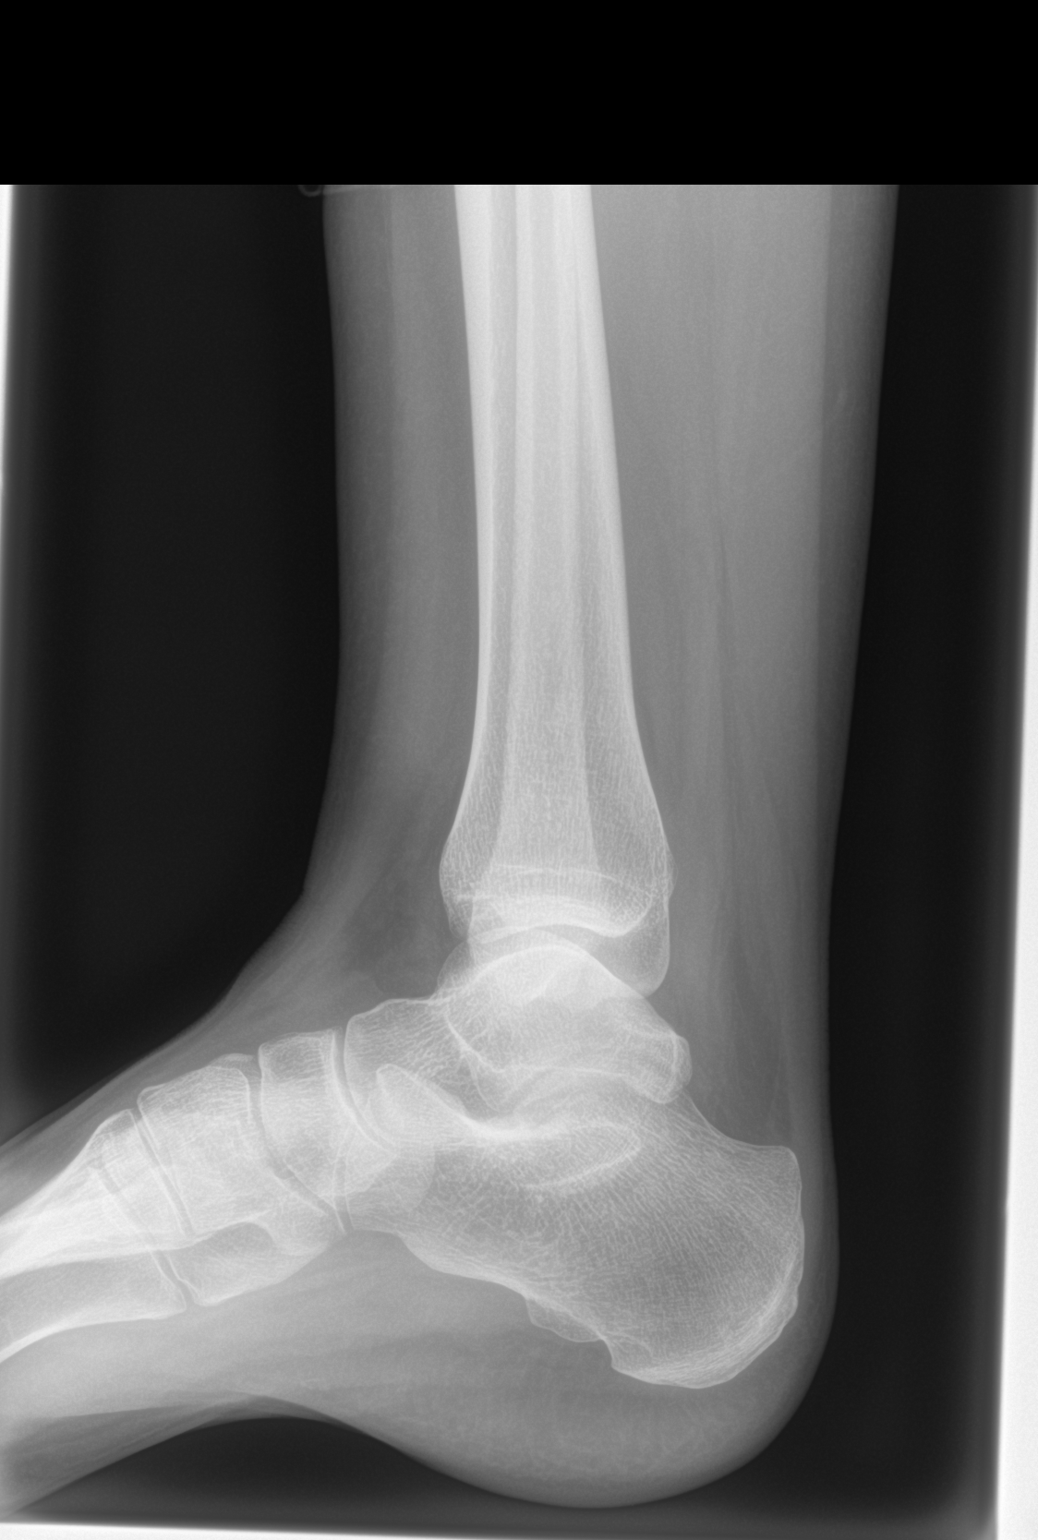

[3 of 3 positions shown; findings below may reference images not displayed]

FINDINGS: There is no evidence of fracture, dislocation, or joint effusion.
There is no evidence of arthropathy or other focal bone abnormality.
Soft tissues are unremarkable.
IMPRESSION: Negative.

## 2021-06-04 IMAGING — DX DG THORACOLUMBAR SPINE 2V
4 series · 4 of 4 positions shown · non-contrast
Comparison: None.

CLINICAL DATA: Possible Jokic kyphosis.

EXAM:
THORACOLUMBAR SPINE 1V

[l-spine ap]
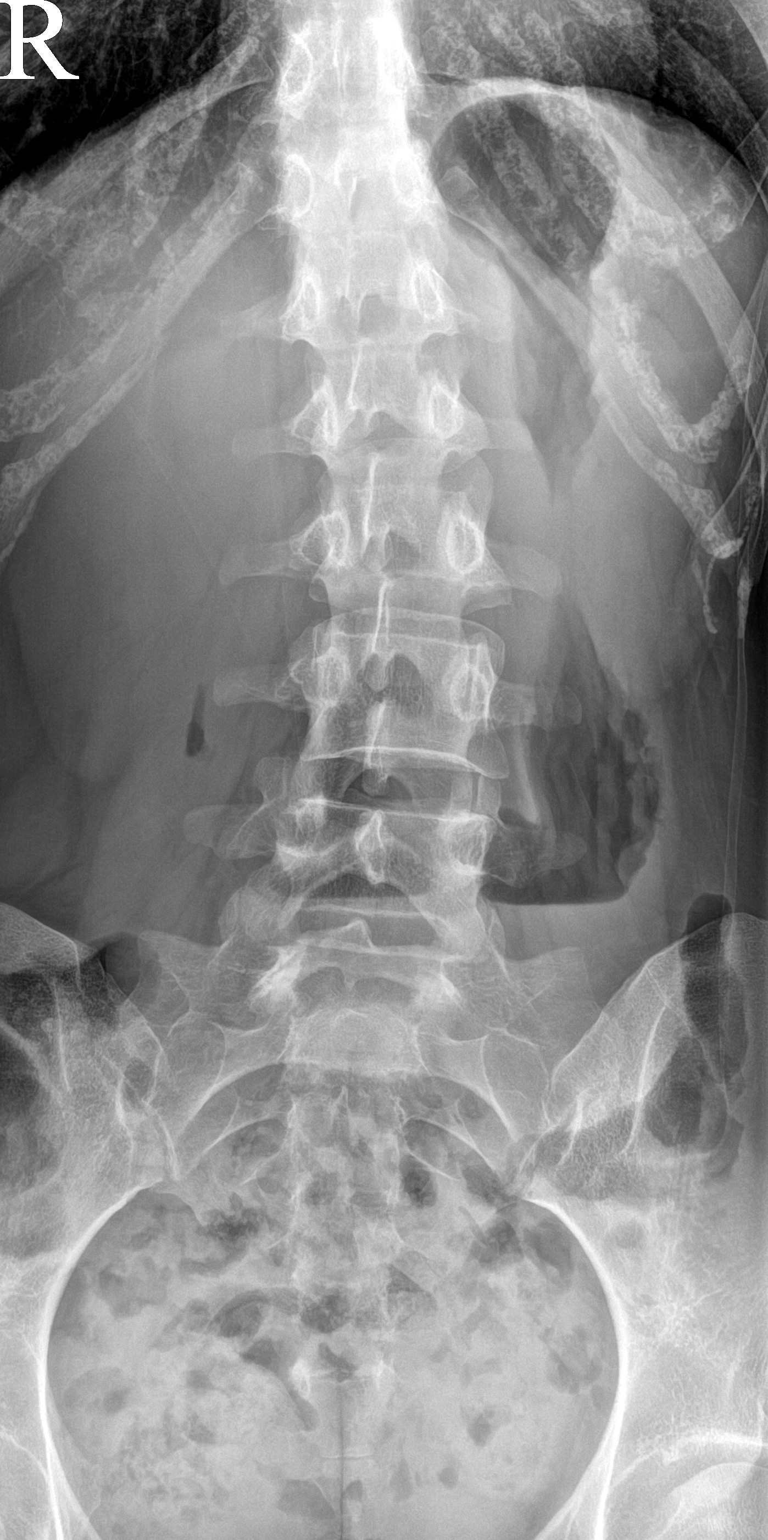

[l-spine lateral]
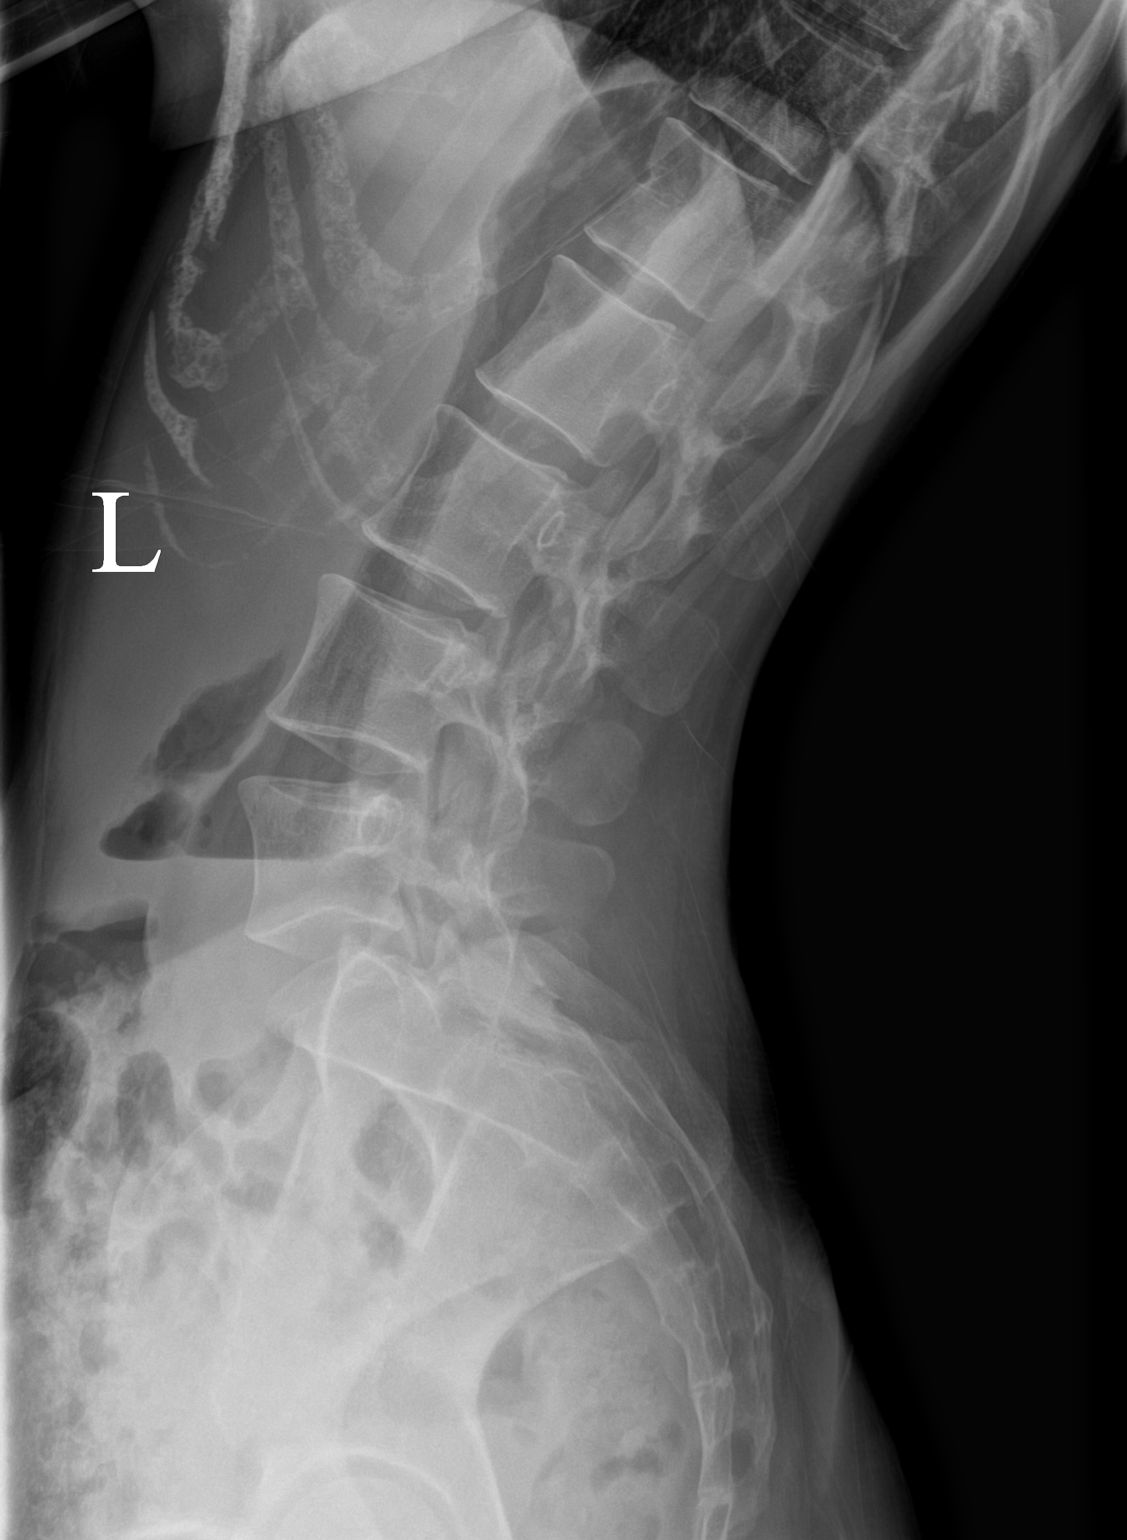

[t-spine ap]
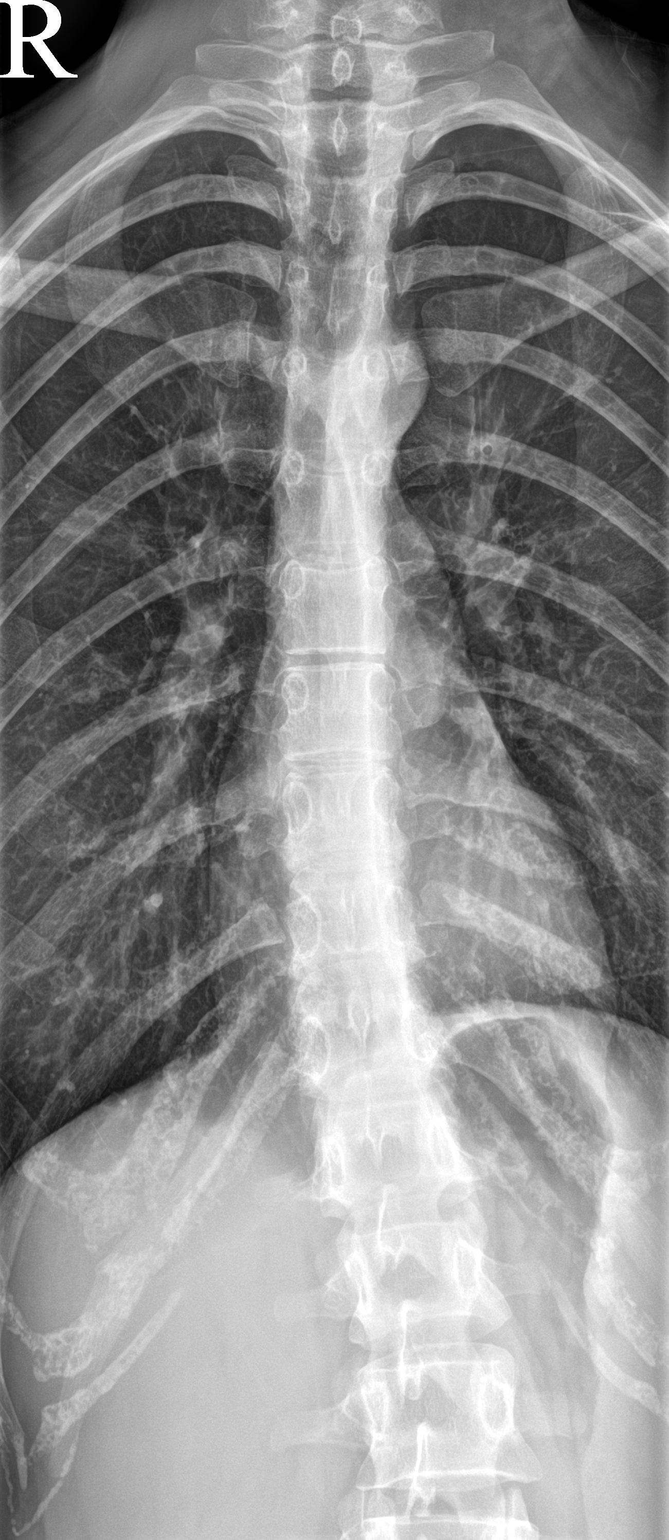

[t-spine lat]
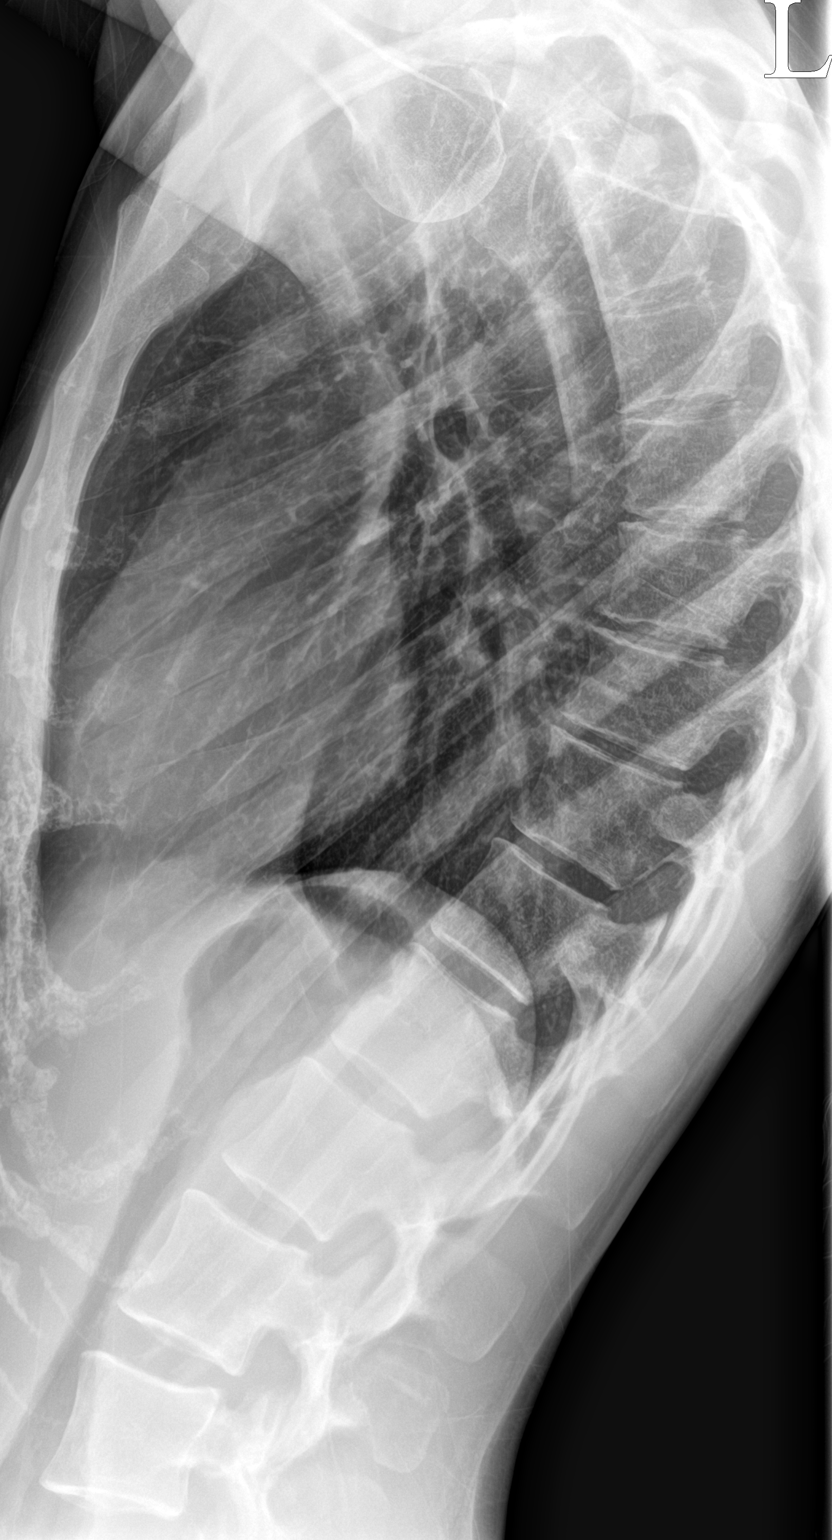

[4 of 4 positions shown; findings below may reference images not displayed]

FINDINGS: Mild biphasic curvature of the thoracolumbar spine with primary
curvature of the thoracic spine convex right and secondary curvature
of the lumbar spine convex left. Minimal exaggerated kyphosis of the
thoracic spine. Vertebral body heights are maintained. No evidence
of compression fracture or spondylolisthesis. Disc space heights are
normal. Pedicles are intact. No anterior vertebral body wedging. No
endplate irregularity/Schmorl's nodes.
IMPRESSION: Minimal exaggerated kyphosis of the thoracic spine with mild
biphasic curvature of the thoracolumbar spine with primary curvature
of the thoracic spine convex right and secondary curvature of the
lumbar spine convex left. No anterior wedging of the vertebral
bodies and no evidence of Schmorl's nodes.

## 2021-07-06 IMAGING — CT CT ABDOMEN W/O CM
2 of 4 series · 15 of 46 positions shown, 17 images · non-contrast
Comparison: None.

CLINICAL DATA: Epigastric pain for several months. Loss of weight.
Nausea.

EXAM:
CT ABDOMEN WITHOUT CONTRAST
TECHNIQUE: Multidetector CT imaging of the abdomen was performed following the
standard protocol without IV contrast.

[Series 2: abd without 5.00 br40 s3 axial · axial · non-contrast · 0.45mm/px · z∈[+1281,+1516]mm · 12 of 53 slices shown, 14 images]
[im 3/53  soft-tissue]
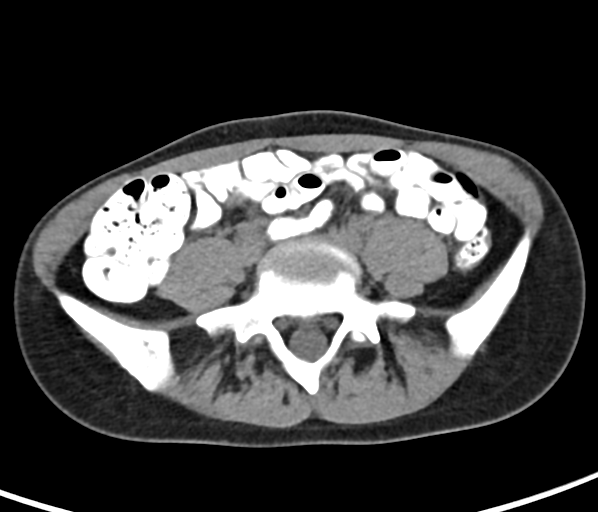
[im 3/53  bone]
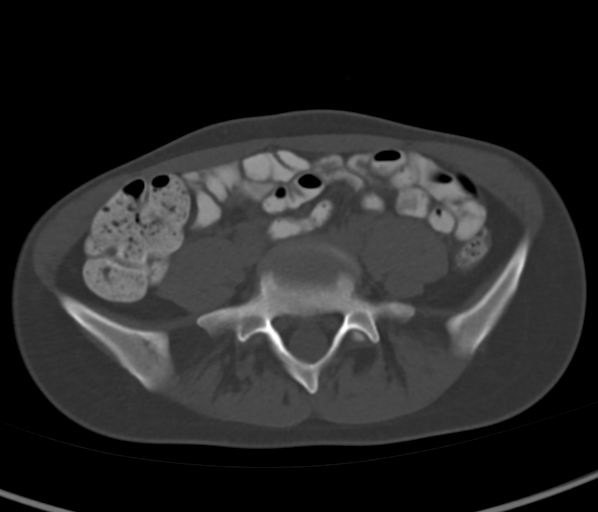
[im 7/53  soft-tissue]
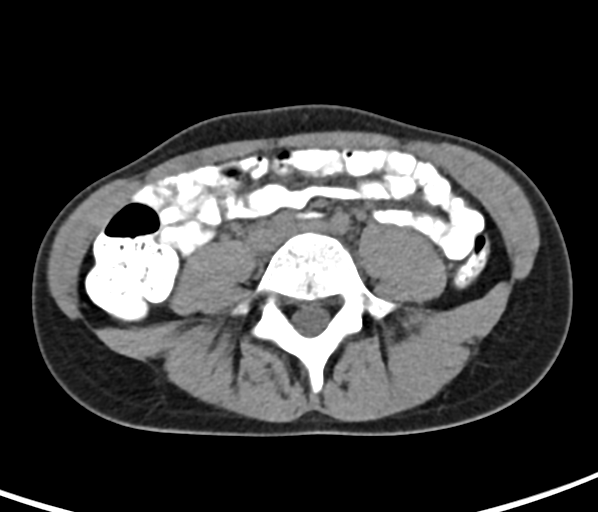
[im 12/53  soft-tissue]
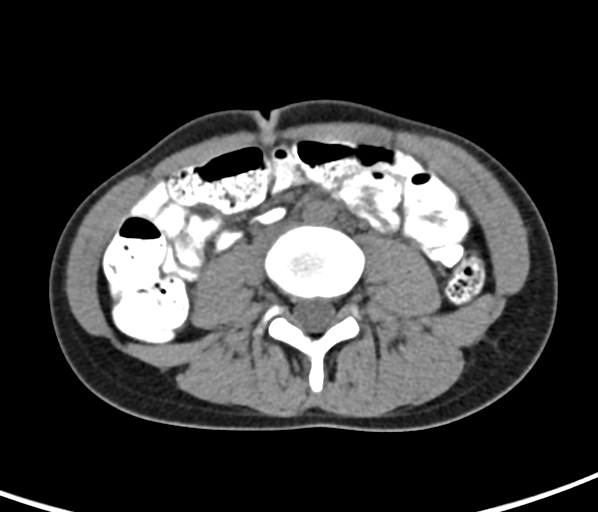
[im 16/53  soft-tissue]
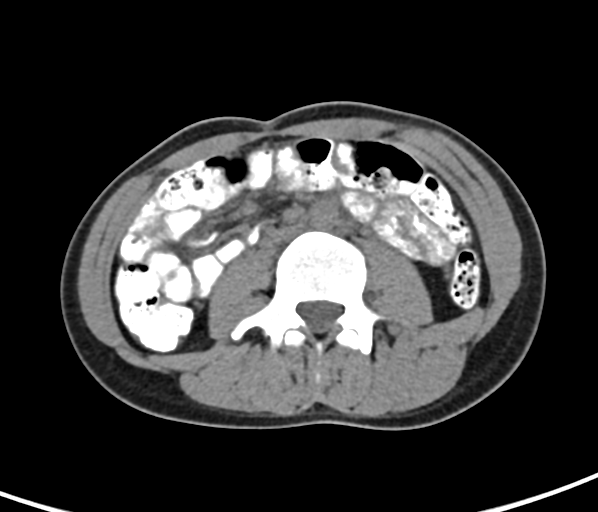
[im 21/53  soft-tissue]
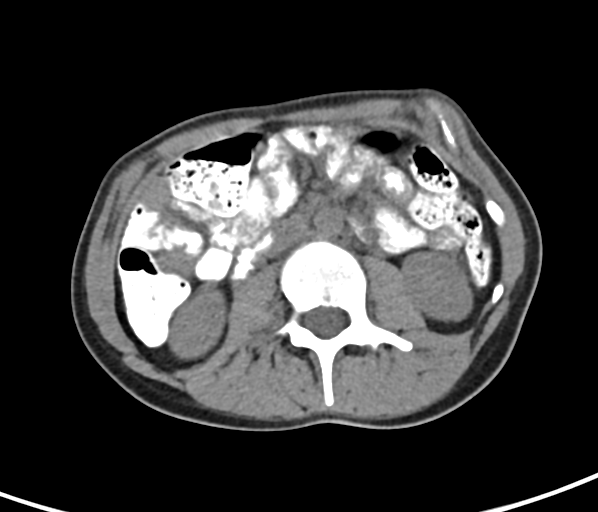
[im 25/53  soft-tissue]
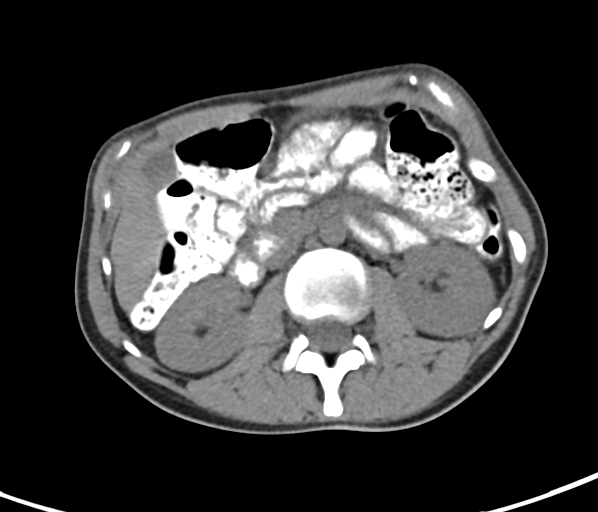
[im 28/53  soft-tissue]
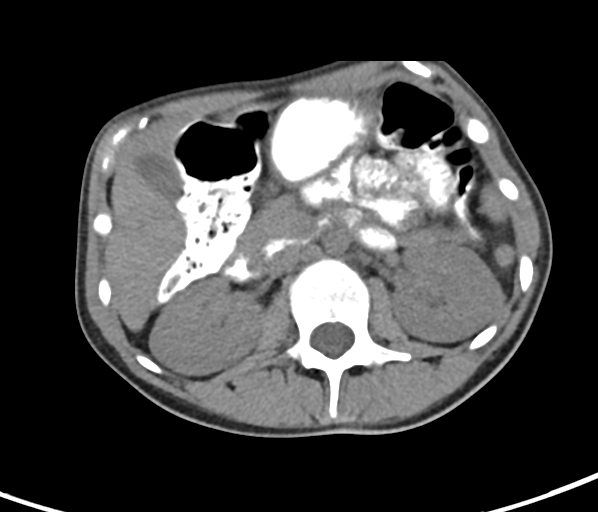
[im 32/53  soft-tissue]
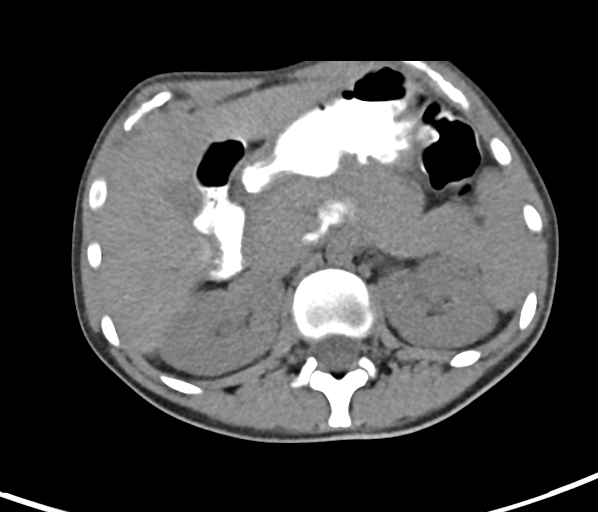
[im 37/53  soft-tissue]
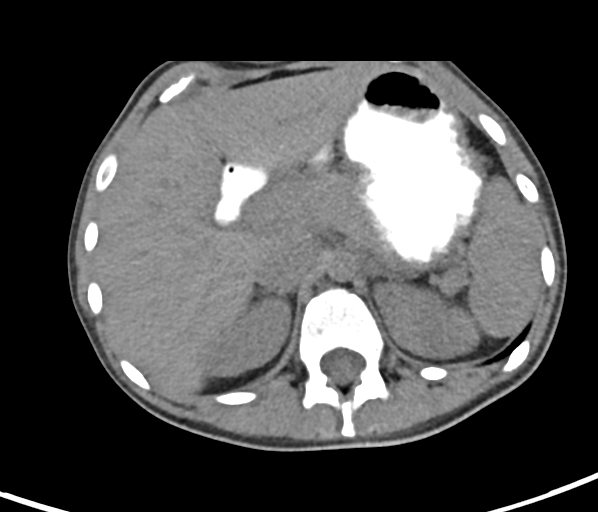
[im 37/53  bone]
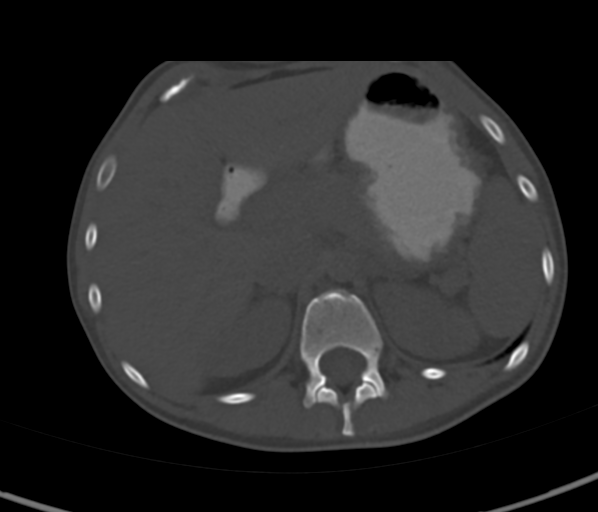
[im 41/53  soft-tissue]
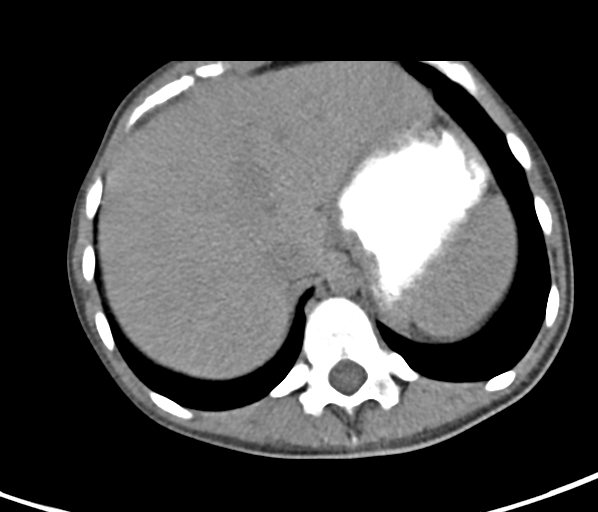
[im 46/53  soft-tissue]
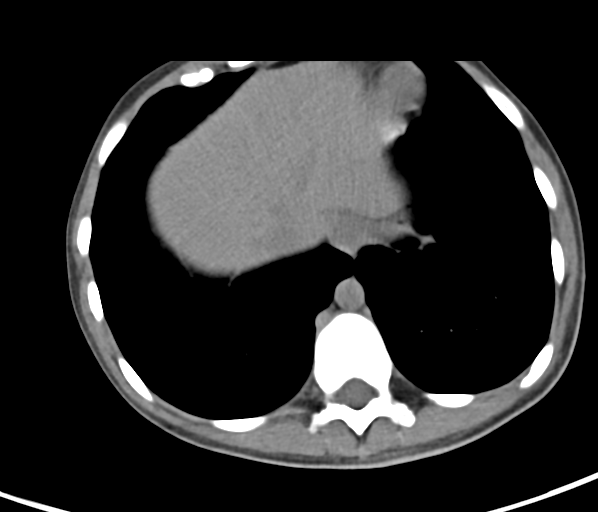
[im 50/53  soft-tissue]
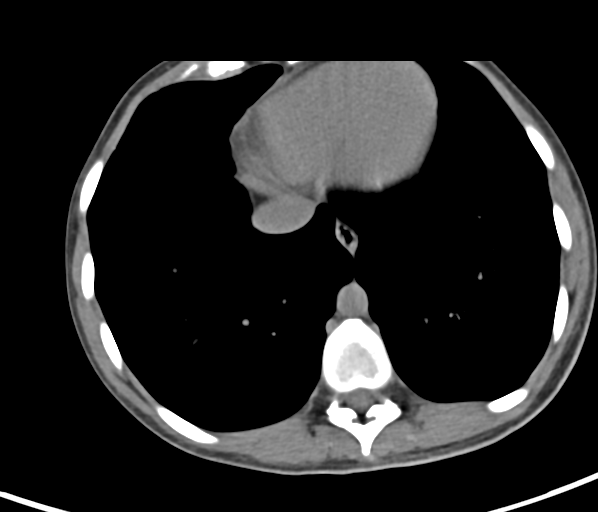

[Series 8: abd without 2.00 br40 s3 cor · coronal · non-contrast · 0.52mm/px · 3 of 115 slices shown]
[im 39/115  soft-tissue]
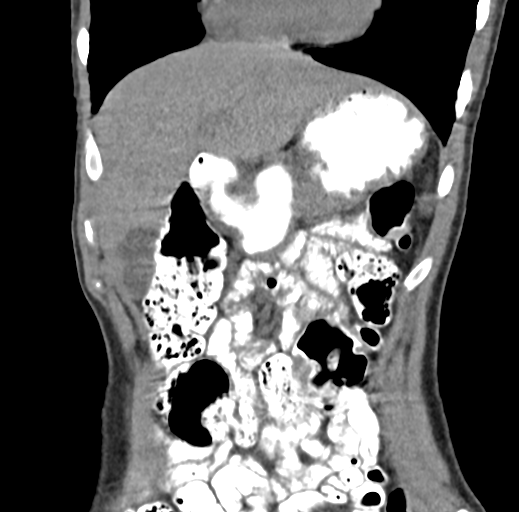
[im 51/115  soft-tissue]
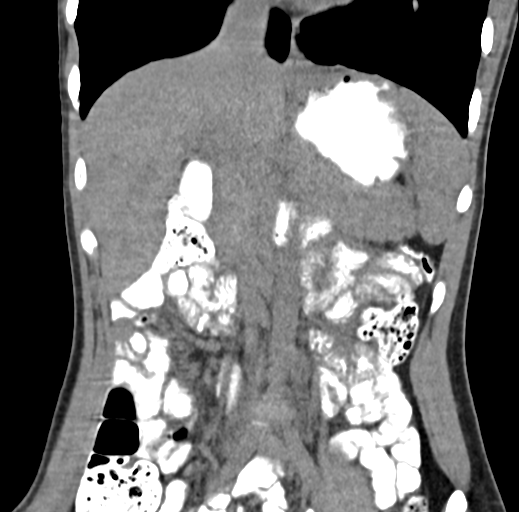
[im 64/115  soft-tissue]
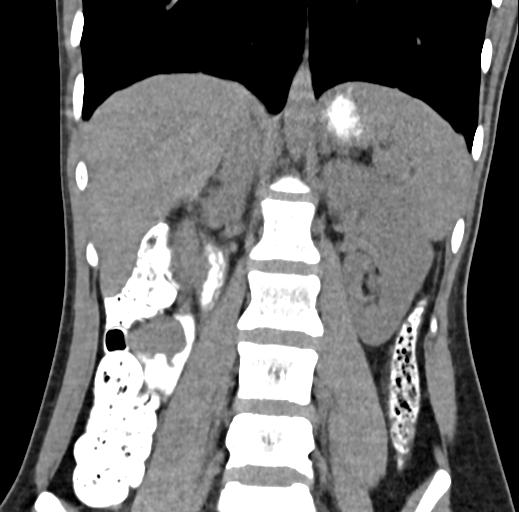

[15 of 46 positions shown; findings below may reference images not displayed]

FINDINGS: Lower chest: No acute abnormality.

Hepatobiliary: No focal liver abnormality is seen. No gallstones,
gallbladder wall thickening, or biliary dilatation.

Pancreas: Unremarkable. No pancreatic ductal dilatation or
surrounding inflammatory changes.

Spleen: Normal in size without focal abnormality.

Adrenals/Urinary Tract: Normal appearance of the adrenal glands. No
kidney mass, stone or hydronephrosis identified bilaterally.

Stomach/Bowel: Stomach is within normal limits. No evidence of bowel
wall thickening, distention, or inflammatory changes.

Vascular/Lymphatic: No significant vascular findings are present. No
enlarged abdominal or pelvic lymph nodes.

Other: No free fluid or fluid collections.

Musculoskeletal: No acute or significant osseous findings.
IMPRESSION: No acute findings within the abdomen. No explanation for patient's
epigastric pain and weight loss.

## 2022-04-26 ENCOUNTER — Telehealth: Payer: Self-pay

## 2022-04-26 NOTE — Telephone Encounter (Signed)
[facility] for transfer, make sure to reference daughter name in appointment note.

## 2022-04-26 NOTE — Telephone Encounter (Signed)
Patients mom would like daughter to [facility] from [facility] to [place]: Maile Linford [phone]

## 2022-05-01 NOTE — Telephone Encounter (Signed)
Patient scheduled.

## 2022-06-01 ENCOUNTER — Encounter: Payer: Self-pay | Admitting: Family Medicine

## 2022-06-01 ENCOUNTER — Ambulatory Visit (INDEPENDENT_AMBULATORY_CARE_PROVIDER_SITE_OTHER): Payer: BC Managed Care – PPO | Admitting: Family Medicine

## 2022-06-01 VITALS — BP 100/60 | HR 71 | Temp 98.3°F | Ht 64.0 in | Wt 96.6 lb

## 2022-06-01 DIAGNOSIS — M419 Scoliosis, unspecified: Secondary | ICD-10-CM

## 2022-06-01 DIAGNOSIS — Z0001 Encounter for general adult medical examination with abnormal findings: Secondary | ICD-10-CM

## 2022-06-01 DIAGNOSIS — E559 Vitamin D deficiency, unspecified: Secondary | ICD-10-CM

## 2022-06-01 DIAGNOSIS — Z23 Encounter for immunization: Secondary | ICD-10-CM

## 2022-06-01 DIAGNOSIS — R636 Underweight: Secondary | ICD-10-CM

## 2022-06-01 DIAGNOSIS — K219 Gastro-esophageal reflux disease without esophagitis: Secondary | ICD-10-CM

## 2022-06-01 MED ORDER — SUCRALFATE 1 G PO TABS: ORAL_TABLET | ORAL | 5 refills | Status: DC

## 2022-06-01 MED ORDER — PANTOPRAZOLE SODIUM 40 MG PO TBEC: 40.0000 mg | DELAYED_RELEASE_TABLET | Freq: Every day | ORAL | 3 refills | Status: DC

## 2022-06-01 NOTE — Addendum Note (Signed)
Addended by: Clyde Lundborg A on: 06/01/2022 10:19 AM   Modules accepted: Orders

## 2022-06-01 NOTE — Progress Notes (Signed)
Phone [phone]   Subjective:  Patient presents today for their annual physical. Chief complaint-noted.   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    Priority: Medium    Scoliosis 06/02/2020    Priority: Low   Past Surgical History:  Procedure Laterality Date   wisdom teeth  04/19/2020   Family History  Problem Relation Age of Onset   Healthy Mother    Colonic polyp Father        age 59   Miscarriages / [place] Sister    Healthy Sister    Healthy Brother    Other Brother        MCAD deficiency- both kids- cannot metabolize medium chain fats - seen in [place]- eat very healthy   Arthritis Maternal Grandmother    Diabetes Maternal Grandmother    Hyperlipidemia Maternal Grandmother    Hypertension Maternal Grandmother    Leukemia Maternal Grandmother        was told "low form"   Arthritis Maternal Grandfather    Diabetes Maternal Grandfather    Hyperlipidemia Maternal Grandfather    Hypertension Maternal Grandfather    Heart disease Maternal Grandfather    Hypertension Paternal Grandmother        lived into 53s   GER disease Paternal Grandmother    Hypertension Paternal Grandfather        lived into 74s   Medications- reviewed and updated Current Outpatient Medications  Medication Sig Dispense Refill   sucralfate (CARAFATE) 1 g tablet TAKE 1 TABLET BY MOUTH 4 TIMES A DAY (1/2 HOUR BEFORE MEALS AND AT BEDTIME) 90     Vitamin D, Ergocalciferol, (DRISDOL) 1.25 MG (50000 UNIT) CAPS capsule One capsule by mouth once a week for 12 weeks. Then take  2000IU/day 12 capsule 0   No current facility-administered medications for this visit.    Allergies-reviewed and updated No Known Allergies  Social History   Social History Narrative   Lives in [place] with mom and dad. Youngest of 5 siblings- no one else in the home. No pets.       Volunteering and looking for employment- in between jobs   - graduated from 2 year program in [place]: listening to music, writing books, youtube, time on phone, tv shows, getting coffee, horseback riding   -enjoys Art gallery manager, fantasy, [place]   Objective  Objective:  BP 100/60   Pulse 71   Temp 98.3 F (36.8 C)   Ht 5' 4"  (1.626 m)   Wt 96 lb 9.6 oz (43.8 kg)   SpO2 99%   BMI 16.58 kg/m  Gen: NAD, resting comfortably HEENT: Mucous membranes are moist. Oropharynx normal Neck: no thyromegaly CV: RRR no murmurs rubs or gallops Lungs: CTAB no crackles, wheeze, rhonchi Abdomen: soft/nontender/nondistended/normal bowel sounds. No rebound or guarding.  Ext: no edema Skin: warm, dry Neuro: grossly normal, moves all extremities, PERRLA   Assessment and Plan   21 y.o. female presenting for annual physical.  Health Maintenance counseling: 1. Anticipatory guidance: Patient counseled regarding regular dental exams -q6 months, eye exams -  about 2 years ago normal,  avoiding smoking and second hand smoke , limiting alcohol to 1 beverage per day- very rare wine cooler , no illicit drugs .   2. Risk factor reduction:  Advised patient of need for regular exercise and diet rich and fruits and vegetables to reduce risk of heart attack and stroke.  Exercise- working out at [employer] 3 days a wek.  Diet/weight management-  Underweight with stable-encouraged gradual mild weight gain by increasing caloric intake. Texture issues Wt Readings from Last 3 Encounters:  06/01/22 96 lb 9.6 oz (43.8 kg)  06/02/20 97 lb (44 kg)  04/28/20 95 lb 3.2 oz (43.2 kg)  3. Immunizations/screenings/ancillary studies-  discussed hcv and HIV screen- low risk for these  - at her age can hold off on further covid vaccine  -discussed tdap - opts in  - discussed HPV vaccination- abstinence plan - opting out Immunization History  Administered Date(s) Administered   DTaP 03/20/1999, 05/24/1999, 08/08/1999, 03/29/2004   Hepatitis A, Ped/Adol-2 Dose 03/01/2006, 07/30/2007   Hepatitis B, ped/adol 02/15/1999, 03/20/1999, 10/31/1999   HiB (PRP-T) 03/20/1999, 05/24/1999, 08/08/1999, 04/22/2000   IPV 03/20/1999, 05/24/1999, 01/23/2000, 04/28/2004   MMR 01/23/2000, 04/28/2004   Meningococcal Conjugate 03/01/2006   Meningococcal Mcv4o 04/21/2020   PFIZER(Purple Top)SARS-COV-2 Vaccination 02/15/2020, 03/08/2020   Pneumococcal Conjugate-13 03/20/1999, 05/24/1999, 08/08/1999, 04/22/2000   Tdap 11/10/2010   Typhoid Inactivated 03/01/2006   Varicella 01/23/2000, 07/30/2007   Yellow Fever 03/01/2006  4. Cervical cancer screening- recommend updating visit with GYN- has never had pelvic- also has painful cycles 5. Breast cancer screening-  breast exam - will plan on with GYN- and mammogram - no early family history of breast  cancer likely start at 89 6. Colon cancer screening - dad with adenomatous polyp age 55- reasonable for colonoscopy at 75 for her 88. Skin cancer screening- has seen Dr. Michele Mcalpine- may do a follow up with him as well. advised regular sunscreen use. Denies worrisome, changing, or new skin lesions.  8. Birth control/STD check- abstinence 9. Osteoporosis screening at 80- consider at later date 98. Smoking associated screening - never smoker  Status of chronic or acute concerns   #Vitamin D deficiency S: Medication: has been off MV- but will restart -On high-dose vitamin D supplementation in the past Last vitamin D Lab Results  Component Value Date   VD25OH 25.52 (L) 04/21/2020  A/P: vitamin D has been low- wants to hold off on labs- advised at least do 2000 units for 2 months of vitamin D in addition to  multivitamin long term and we can check at a future visit   #Learning disability (non verbal)- wants to get plugged in with career counselor with learning disability issues. Had 2 year program completed in [place].   # GERD S:Medication: carafate/sucralfate. Prior on pantoprazole 40 mg- but did not make a big difference. Still has bed elevated. Nexium otc helps some.  - upper chest discomfort when eating and upper abdomen bothers her -Has seen Dr. Cristina Gong  (now retired) in the past and had endoscopy 05/17/2020-normal LV-small hiatal hernia -texture issues for years -did agree with MV A/P: Reflux worsening- recommend restart pantoprazole- if no better in 1-2 months/not back to baseline recommend following up with [facility] again  -also asks for Carafate/sucralfate  refill which was sent in -also starting MV with B12- has had macrocytosis in the past- discussed could be b12 related  #Hypermobility syndrome-we will send to Dr. Oneida Alar in 2021 and evaluation showed "On my evaluation she  has some hypermobility but not the pattern seen in EDS More localized with both elbows affected She does have some laxity of shoulders and ligamentous breakdown of feet   With Beighton score < 5 and no clear associated EDS features, I am not sure any genetic testing will identify a specific cause   She will need long term orthotic or arch support for feet If she does at some point have subluxations we would recommend specific HEP" -Has seen physical therapy as well for her ankle - joined gym for her ankle -ended up not doing echo as was not firmly diagnosed  #scoliosis/posture issues- from Dr. Georgina Snell noted "thoracic kyphosis" and mild scoliosis. Did some physical therapy- still has exercises on her phone she could - not having back pain.  -not having current issues so plans to go back to prior PT and work with personal trainer at [employer]  Recommended follow up: Return in about 4 months (around 10/01/2022) for  followup or sooner if needed.Schedule b4 you leave.  Lab/Order associations:   ICD-10-CM   1. Encounter for general adult medical examination with abnormal findings  Z00.01     2. Vitamin D deficiency  E55.9     3. Gastroesophageal reflux disease, unspecified whether esophagitis present  K21.9     4. Scoliosis, unspecified scoliosis type, unspecified spinal region  M41.9     5. Underweight  R63.6      No orders of the defined types were placed in this encounter.  Return precautions advised.  Garret Reddish, MD

## 2022-06-01 NOTE — Patient Instructions (Addendum)
Health Maintenance Due  Topic Date Due   HPV VACCINES (1 - 2-dose series)-opts out for now Never done   PAP-Cervical Cytology Screening -never had one but agrees to call local GYN to schedule Never done   COVID-19 Vaccine (3 - [place] series)-will mychart [place] dates #3- otherwise can hold off on further covid vaccinations unless you prefer to have another 05/03/2020   TDAP - today if has not had in [place] 11/10/2020   Reflux worsening- recommend restart pantoprazole- if no better in 1-2 months/not back to baseline recommend following up with [facility] again  -also asks for Carafate/sucralfate  refill which was sent in  - advised at least do 2000 units for 2 months of vitamin D in addition to multivitamin long term and we can check at a future visit   Recommended follow up: Return in about 4 months (around 10/01/2022) for followup or sooner if needed.Schedule b4 you leave.

## 2022-08-10 ENCOUNTER — Other Ambulatory Visit: Payer: Self-pay | Admitting: Family Medicine

## 2022-08-27 ENCOUNTER — Encounter: Payer: Self-pay | Admitting: *Deleted

## 2022-10-01 ENCOUNTER — Encounter: Payer: Self-pay | Admitting: Family Medicine

## 2022-10-01 ENCOUNTER — Ambulatory Visit (INDEPENDENT_AMBULATORY_CARE_PROVIDER_SITE_OTHER): Payer: BC Managed Care – PPO | Admitting: Family Medicine

## 2022-10-01 VITALS — BP 90/64 | HR 89 | Temp 97.9°F | Ht 64.0 in | Wt 100.6 lb

## 2022-10-01 DIAGNOSIS — N946 Dysmenorrhea, unspecified: Secondary | ICD-10-CM | POA: Diagnosis not present

## 2022-10-01 DIAGNOSIS — K219 Gastro-esophageal reflux disease without esophagitis: Secondary | ICD-10-CM | POA: Diagnosis not present

## 2022-10-01 DIAGNOSIS — L709 Acne, unspecified: Secondary | ICD-10-CM | POA: Diagnosis not present

## 2022-10-01 NOTE — Patient Instructions (Addendum)
  Recommend GYN follow up- could also discuss birth control option with them  - could try pantoprazole 30 minutes before dinner to see if it makes a difference for her -would love to reduce dose long term but only if symptom free -hold off on GI follow up for now- if worsens certainly see [facility] again -if doing well with changing timing we could trial 20 mg dose  Lets trial '600mg'$  of ibuprofen every 6 hours for 3 days at very beginning of menstruation- if not improving or worsens reflux- could try either celebrex (if stomach issues) or birth control pills  Recommended follow up: Return in about 8 months (around 06/02/2023) for physical or sooner if needed.Schedule b4 you leave.

## 2022-10-01 NOTE — Progress Notes (Signed)
Phone [phone] In person visit   Subjective:   Kelly Warren is a 21 y.o. year old very pleasant female patient who presents for/with See problem oriented charting Chief Complaint  Patient presents with   Follow-up   Gastroesophageal Reflux    States is has been getting better but its acting.   Acne    Pt states she is breaking out with acne all over.   Dysmenorrhea    Pt c/o painful menstrual cramps   Past Medical History-  Patient Active Problem List   Diagnosis Date Noted   Learning disability 04/21/2020    Priority: High   Dysmenorrhea 10/01/2022    Priority: Medium    Acne 10/01/2022    Priority: Medium    GERD (gastroesophageal reflux disease) 10/01/2022    Priority: Medium    Vitamin D deficiency 04/22/2020    Priority: Medium    Hypermobility syndrome 04/21/2020    Priority: Medium    Scoliosis 06/02/2020    Priority: Low    Medications- reviewed and updated Current Outpatient Medications  Medication Sig Dispense Refill   pantoprazole (PROTONIX) 40 MG tablet TAKE 1 TABLET BY MOUTH EVERY DAY 90 tablet 3   sucralfate (CARAFATE) 1 g tablet TAKE 1 TABLET BY MOUTH 4 TIMES A DAY (1/2 HOUR BEFORE MEALS AND AT BEDTIME) 90 120 tablet 5   Vitamin D, Ergocalciferol, (DRISDOL) 1.25 MG (50000 UNIT) CAPS capsule One capsule by mouth once a week for 12 weeks. Then take 2000IU/day 12 capsule 0   No current facility-administered medications for this visit.     Objective:  BP 90/64   Pulse 89   Temp 97.9 F (36.6 C)   Ht '5\' 4"'$  (1.626 m)   Wt 100 lb 9.6 oz (45.6 kg)   SpO2 100%   BMI 17.27 kg/m  Gen: NAD, resting comfortably CV: RRR no murmurs rubs or gallops Lungs: CTAB no crackles, wheeze, rhonchi Abdomen: soft/nontender- other than perhaps mild with deep palpation/nondistended/normal bowel sounds.  Ext: no edema Skin: warm, dry    Assessment and Plan   # GERD S:overall has improved- currently taking pantoprazole 40 mg daily as well as the carafate only  at bedtime. Had seen [facility] back in 2021 including endoscopy.   - most commonly at night and in the morning. Takes pantoprazole 40.  - maybe once a week gets mild burning/chest tightness -also can get some loose stools in the morning- celiac testing in the past reportedly normal- has tried to go gluten light -low on dairy -not aware of particular foods that trigger it -no choking issues in the am like in the past A/P: pretty well controlled but perhaps once a week mild symptoms - opted to continue current meds- could try pantoprazole 30 minutes before dinner to see if it makes a difference for her -would love to reduce dose long term but only if symptom free -hold off on GI follow up for now- if worsens certainly see [facility] again See last note  # painful menstruation cycles.  S:abstinence for birth control. Cycles are 5 days every 5 weeks. Not usually too heavy- occasionally is heavier- per mom more average.  - starts with midol complete when gets cramps- usually 3 x a day for 3 days. Has tried heating pad.   - no bleeding outside of cycles A/P: Lets trial '600mg'$  of ibuprofen every 6 hours for 3 days at very beginning of menstruation- if not improving or worsens reflux- could try either celebrex (if stomach issues)  or birth control pills   # Acne S:reports issues with acne - popped out several months ago and still on back and chest   - saw Dr. Michele Mcalpine yesterday for separate issue and they mentioned possible accutane A/P: wants to follow up with Dr. Michele Mcalpine over alternate options   Recommended follow up: Return in about 8 months (around 06/02/2023) for physical or sooner if needed.Schedule b4 you leave.  Lab/Order associations:   ICD-10-CM   1. Dysmenorrhea  N94.6     2. Acne, unspecified acne type  L70.9     3. Gastroesophageal reflux disease, unspecified whether esophagitis present  K21.9      Return precautions advised.  Garret Reddish, MD

## 2022-11-09 ENCOUNTER — Ambulatory Visit (INDEPENDENT_AMBULATORY_CARE_PROVIDER_SITE_OTHER): Payer: BC Managed Care – PPO | Admitting: Internal Medicine

## 2022-11-09 ENCOUNTER — Encounter: Payer: Self-pay | Admitting: Internal Medicine

## 2022-11-09 VITALS — BP 118/77 | HR 93 | Temp 98.0°F | Resp 12 | Ht 64.0 in | Wt 98.0 lb

## 2022-11-09 DIAGNOSIS — J029 Acute pharyngitis, unspecified: Secondary | ICD-10-CM | POA: Diagnosis not present

## 2022-11-09 DIAGNOSIS — J328 Other chronic sinusitis: Secondary | ICD-10-CM

## 2022-11-09 DIAGNOSIS — J069 Acute upper respiratory infection, unspecified: Secondary | ICD-10-CM | POA: Diagnosis not present

## 2022-11-09 DIAGNOSIS — R52 Pain, unspecified: Secondary | ICD-10-CM | POA: Diagnosis not present

## 2022-11-09 DIAGNOSIS — R051 Acute cough: Secondary | ICD-10-CM | POA: Diagnosis not present

## 2022-11-09 LAB — POCT RAPID STREP A (OFFICE): Rapid Strep A Screen: NEGATIVE

## 2022-11-09 LAB — POCT INFLUENZA A/B
Influenza A, POC: NEGATIVE
Influenza B, POC: NEGATIVE

## 2022-11-09 LAB — POC COVID19 BINAXNOW: SARS Coronavirus 2 Ag: NEGATIVE

## 2022-11-09 MED ORDER — LORATADINE 10 MG PO TABS: 10.0000 mg | ORAL_TABLET | Freq: Every day | ORAL | 11 refills | Status: AC

## 2022-11-09 MED ORDER — AMOXICILLIN-POT CLAVULANATE 875-125 MG PO TABS: 1.0000 | ORAL_TABLET | Freq: Two times a day (BID) | ORAL | 0 refills | Status: DC

## 2022-11-09 MED ORDER — SIMPLY SALINE 0.9 % NA AERS: 2.0000 | INHALATION_SPRAY | Freq: Every day | NASAL | 1 refills | Status: DC | PRN

## 2022-11-09 MED ORDER — FLUTICASONE PROPIONATE 50 MCG/ACT NA SUSP: 2.0000 | Freq: Every day | NASAL | 6 refills | Status: AC

## 2022-11-09 MED ORDER — PSEUDOEPHEDRINE HCL ER 120 MG PO TB12: 120.0000 mg | ORAL_TABLET | Freq: Two times a day (BID) | ORAL | 0 refills | Status: DC

## 2022-11-09 NOTE — Progress Notes (Signed)
Acute Office Visit  Subjective:     Patient ID: Kelly Warren, female    DOB: May 03, 2001, 21 y.o.   MRN: [id number]  Chief Complaint  Patient presents with   Headache    Ongoing   Sore Throat    No fever, symptoms since last Thursday.   Head congestion   Fatigue   Cough    Produces a little clear mucus.   Generalized Body Aches   Nasal Congestion   Headache  Associated symptoms include coughing and eye redness. Pertinent negatives include no fever.  Sore Throat  Associated symptoms include coughing and headaches.  Cough Associated symptoms include eye redness and headaches. Pertinent negatives include no chills, fever or rash.   Patient is in today for sick x 1 week.  More wore out at night.  Father gives story. He has similar infection.   Father is taking prednisone x 2 days for the infection and thinks he is coughing a little less, feels better, same amount of nasal congestion.  Patient having headache, nasal congestion,  myalgias.  Review of Systems  Constitutional:  Positive for malaise/fatigue (a little). Negative for chills and fever.  Eyes:  Positive for redness.  Respiratory:  Positive for cough.   Skin:  Negative for itching and rash.  Neurological:  Positive for headaches.        Objective:    BP 118/77 (BP Location: Left Arm, Patient Position: Sitting)   Pulse 93   Temp 98 F (36.7 C) (Temporal)   Resp 12   Ht '5\' 4"'$  (1.626 m)   Wt 98 lb (44.5 kg)   SpO2 99%   BMI 16.82 kg/m  BP Readings from Last 3 Encounters:  11/09/22 118/77  10/01/22 90/64  06/01/22 100/60   Physical Exam Sniffles, clear heart and lungs ausc.  Hard to see throat  Results for orders placed or performed in visit on 11/09/22  POCT Influenza A/B  Result Value Ref Range   Influenza A, POC Negative Negative   Influenza B, POC Negative Negative  POC COVID-19  Result Value Ref Range   SARS Coronavirus 2 Ag Negative Negative  POCT rapid strep A  Result Value Ref Range   Rapid  Strep A Screen Negative Negative        Assessment & Plan:   Problem List Items Addressed This Visit   None Visit Diagnoses     Sore throat    -  Primary   Relevant Orders   POCT rapid strep A (Completed)   Body aches       Relevant Orders   POCT Influenza A/B (Completed)   Acute cough       Relevant Orders   POCT Influenza A/B (Completed)   POC COVID-19 (Completed)   Upper respiratory tract infection, unspecified type       Relevant Medications   fluticasone (FLONASE) 50 MCG/ACT nasal spray   Saline (SIMPLY SALINE) 0.9 % AERS   loratadine (CLARITIN) 10 MG tablet   pseudoephedrine (SUDAFED 12 HOUR) 120 MG 12 hr tablet   Other chronic sinusitis       Relevant Medications   amoxicillin-clavulanate (AUGMENTIN) 875-125 MG tablet   fluticasone (FLONASE) 50 MCG/ACT nasal spray   Saline (SIMPLY SALINE) 0.9 % AERS   loratadine (CLARITIN) 10 MG tablet   pseudoephedrine (SUDAFED 12 HOUR) 120 MG 12 hr tablet       Meds ordered this encounter  Medications   amoxicillin-clavulanate (AUGMENTIN) 875-125 MG tablet    Sig:  Take 1 tablet by mouth 2 (two) times daily. Don't take unless pus is seen or infection longer than 10 days    Dispense:  20 tablet    Refill:  0   fluticasone (FLONASE) 50 MCG/ACT nasal spray    Sig: Place 2 sprays into both nostrils daily.    Dispense:  16 g    Refill:  6   Saline (SIMPLY SALINE) 0.9 % AERS    Sig: Place 2 each into the nose daily as needed.    Dispense:  500 mL    Refill:  1   loratadine (CLARITIN) 10 MG tablet    Sig: Take 1 tablet (10 mg total) by mouth daily.    Dispense:  30 tablet    Refill:  11   pseudoephedrine (SUDAFED 12 HOUR) 120 MG 12 hr tablet    Sig: Take 1 tablet (120 mg total) by mouth 2 (two) times daily.    Dispense:  20 tablet    Refill:  0   Loralee Pacas, MD

## 2022-11-09 NOTE — Patient Instructions (Signed)
It was a pleasure seeing you today!  Your health and satisfaction are my top priorities. If you believe your experience today was worthy of a 5-star rating, I'd be grateful for your feedback! Loralee Pacas, MD   REMINDERS:  '[]'$    I think its a virus other than COVID or flu. Take simply saline rinses prior to Flonase. Return as needed. Augmentin is only for if you see pus or it goes on over 10 days.    Today's draft of the physician documented plan for today's visit: (final revisions will be visible on MyChart chart later) Sore throat -     POCT rapid strep A  Body aches -     POCT Influenza A/B  Acute cough -     POCT Influenza A/B -     POC COVID-19 BinaxNow  Upper respiratory tract infection, unspecified type -     Fluticasone Propionate; Place 2 sprays into both nostrils daily.  Dispense: 16 g; Refill: 6 -     Simply Saline; Place 2 each into the nose daily as needed.  Dispense: 500 mL; Refill: 1 -     Loratadine; Take 1 tablet (10 mg total) by mouth daily.  Dispense: 30 tablet; Refill: 11 -     Pseudoephedrine HCl ER; Take 1 tablet (120 mg total) by mouth 2 (two) times daily.  Dispense: 20 tablet; Refill: 0  Other chronic sinusitis -     Amoxicillin-Pot Clavulanate; Take 1 tablet by mouth 2 (two) times daily. Don't take unless pus is seen or infection longer than 10 days  Dispense: 20 tablet; Refill: 0     QUESTIONS & CONCERNS: CLINICAL: please contact us via phone [phone] OR MyChart messaging  LAB & IMAGING:   We will call you if the results are significantly abnormal or you don't use MyChart.  Most normal results will be posted to MyChart immediately and have a clinical review message by Dr. Randol Kern posted within 2-3 business days.   If you have not heard from [place] regarding the results in 2 weeks OR if you need priority reporting, please contact this office. MYCHART:  The fastest way to get your results and easiest way to stay in touch with [place] is by activating your My  Chart account. Instructions are located on the last page of this paperwork.  BILLING: xray and lab orders are billed from separate companies and questions./concerns should be directed to the [facility].  For visit charges please discuss with our administrative services COMPLAINTS:  please let Dr. Randol Kern know or see the [facility], by asking at the front desk: we want you to be satisfied with every experience and we would be grateful for the opportunity to address any problems

## 2023-03-21 ENCOUNTER — Other Ambulatory Visit: Payer: Self-pay | Admitting: Family Medicine

## 2023-10-18 ENCOUNTER — Ambulatory Visit (INDEPENDENT_AMBULATORY_CARE_PROVIDER_SITE_OTHER): Payer: BC Managed Care – PPO | Admitting: Family Medicine

## 2023-10-18 ENCOUNTER — Encounter: Payer: Self-pay | Admitting: Family Medicine

## 2023-10-18 VITALS — BP 108/60 | HR 80 | Temp 98.7°F | Ht 64.0 in | Wt 99.0 lb

## 2023-10-18 DIAGNOSIS — R6250 Unspecified lack of expected normal physiological development in childhood: Secondary | ICD-10-CM | POA: Insufficient documentation

## 2023-10-18 DIAGNOSIS — K2930 Chronic superficial gastritis without bleeding: Secondary | ICD-10-CM | POA: Insufficient documentation

## 2023-10-18 DIAGNOSIS — J029 Acute pharyngitis, unspecified: Secondary | ICD-10-CM | POA: Diagnosis not present

## 2023-10-18 DIAGNOSIS — R1013 Epigastric pain: Secondary | ICD-10-CM | POA: Insufficient documentation

## 2023-10-18 DIAGNOSIS — N3000 Acute cystitis without hematuria: Secondary | ICD-10-CM

## 2023-10-18 DIAGNOSIS — R110 Nausea: Secondary | ICD-10-CM | POA: Insufficient documentation

## 2023-10-18 DIAGNOSIS — K2960 Other gastritis without bleeding: Secondary | ICD-10-CM | POA: Insufficient documentation

## 2023-10-18 DIAGNOSIS — R051 Acute cough: Secondary | ICD-10-CM

## 2023-10-18 DIAGNOSIS — R52 Pain, unspecified: Secondary | ICD-10-CM

## 2023-10-18 DIAGNOSIS — R102 Pelvic and perineal pain: Secondary | ICD-10-CM

## 2023-10-18 DIAGNOSIS — R634 Abnormal weight loss: Secondary | ICD-10-CM | POA: Insufficient documentation

## 2023-10-18 LAB — POCT URINALYSIS DIPSTICK
Bilirubin, UA: NEGATIVE
Blood, UA: POSITIVE
Glucose, UA: NEGATIVE
Ketones, UA: NEGATIVE
Nitrite, UA: NEGATIVE
Protein, UA: NEGATIVE
Spec Grav, UA: 1.015 (ref 1.010–1.025)
Urobilinogen, UA: 0.2 U/dL
pH, UA: 7 (ref 5.0–8.0)

## 2023-10-18 LAB — POC INFLUENZA A&B (BINAX/QUICKVUE)
Influenza A, POC: NEGATIVE
Influenza B, POC: NEGATIVE

## 2023-10-18 LAB — POC COVID19 BINAXNOW: SARS Coronavirus 2 Ag: NEGATIVE

## 2023-10-18 MED ORDER — CEPHALEXIN 500 MG PO CAPS: 500.0000 mg | ORAL_CAPSULE | Freq: Two times a day (BID) | ORAL | 0 refills | Status: AC

## 2023-10-18 NOTE — Progress Notes (Signed)
   Subjective:    Patient ID: Kelly Warren, female    DOB: November 06, 2001, 22 y.o.   MRN: [id number]  HPI Sore throat- sxs started 6 days ago.  + body aches, dry cough, congestion, HA.  'my snot was green'.  Denies facial pain/pressure.  Mild ear pain.  Still very painful to swallow.  + nausea and diarrhea.  + subjective fevers.  Sister has walking pna and she spent the weekend w/ them.  Suprapubic pain- sxs started on Wednesday.  No burning w/ urination.  + increased frequency.   Review of Systems For ROS see HPI     Objective:   Physical Exam Vitals reviewed.  Constitutional:      General: She is not in acute distress.    Appearance: She is not ill-appearing.  HENT:     Head: Normocephalic and atraumatic.     Right Ear: Tympanic membrane and ear canal normal.     Left Ear: Tympanic membrane and ear canal normal.     Nose: Congestion present. No rhinorrhea.     Mouth/Throat:     Mouth: Mucous membranes are moist. No oral lesions.     Pharynx: Oropharynx is clear. Uvula midline. No oropharyngeal exudate or posterior oropharyngeal erythema.  Eyes:     Conjunctiva/sclera: Conjunctivae normal.  Cardiovascular:     Rate and Rhythm: Normal rate and regular rhythm.  Pulmonary:     Effort: Pulmonary effort is normal. No respiratory distress.     Breath sounds: No wheezing or rhonchi.     Comments: + dry cough Abdominal:     General: There is no distension.     Palpations: Abdomen is soft.     Tenderness: There is abdominal tenderness (suprapubic). There is no guarding.  Musculoskeletal:     Cervical back: Neck supple.  Lymphadenopathy:     Cervical: Cervical adenopathy (shotty) present.  Skin:    General: Skin is warm and dry.     Coloration: Skin is pale.  Neurological:     Mental Status: She is alert.           Assessment & Plan:  Suprapubic pain- new.  Pt's urinary frequency and suprapubic pain is suggestive of UTI.  UA shows blood and leuks which is further suggestive  of infxn.  Will send for cx but start Keflex while awaiting sensitivities.  Mom expressed understanding and agreement.  Acute cough- new.  No evidence of bacterial infxn on PE.  She does have 'walking PNA' exposure but lungs are currently clear.  Will treat as viral illness w/ supportive care but encouraged mom to reach out if cough worsens or becomes wet, she has a documented fever, or other concerns.  At that time could add Azithro to cover for possible mycoplasma.  Mom expressed understanding and agreement.

## 2023-10-18 NOTE — Patient Instructions (Signed)
Follow up as needed or as scheduled Your urine is suggestive of a UTI so we are going to start Cephalexin twice daily Drink plenty of fluids to flush your bladder Thankfully your flu and COVID tests are negative- this is great news! Your lungs are currently clear- this is great news!  Take Tylenol or Ibuprofen as needed for fever, use Robitussin or Delsym as needed for cough REST! Call with any questions or concerns Hang in there!!

## 2023-10-19 LAB — URINE CULTURE
MICRO NUMBER:: 15737396
SPECIMEN QUALITY:: ADEQUATE

## 2023-10-20 ENCOUNTER — Encounter: Payer: Self-pay | Admitting: Family Medicine

## 2023-10-21 ENCOUNTER — Telehealth: Payer: Self-pay

## 2023-10-21 MED ORDER — AZITHROMYCIN 250 MG PO TABS: ORAL_TABLET | ORAL | 0 refills | Status: DC

## 2023-10-21 NOTE — Addendum Note (Signed)
Addended by: Sheliah Hatch on: 10/21/2023 07:44 AM   Modules accepted: Orders

## 2023-10-21 NOTE — Telephone Encounter (Signed)
-----   Message from Neena Rhymes sent at 10/20/2023  5:24 PM EST ----- Your urine culture does not indicate a urinary tract infection.  Please update [place] on how you are feeling

## 2023-10-28 ENCOUNTER — Encounter: Payer: Self-pay | Admitting: Family Medicine

## 2023-10-28 ENCOUNTER — Ambulatory Visit: Payer: BC Managed Care – PPO | Admitting: Family Medicine

## 2023-10-28 VITALS — BP 104/54 | HR 88 | Temp 98.6°F | Ht 64.0 in | Wt 99.2 lb

## 2023-10-28 DIAGNOSIS — B2790 Infectious mononucleosis, unspecified without complication: Secondary | ICD-10-CM | POA: Diagnosis not present

## 2023-10-28 DIAGNOSIS — J029 Acute pharyngitis, unspecified: Secondary | ICD-10-CM

## 2023-10-28 LAB — BASIC METABOLIC PANEL
BUN: 14 mg/dL (ref 6–23)
CO2: 27 meq/L (ref 19–32)
Calcium: 9.5 mg/dL (ref 8.4–10.5)
Chloride: 104 meq/L (ref 96–112)
Creatinine, Ser: 0.67 mg/dL (ref 0.40–1.20)
GFR: 122.04 mL/min (ref >60.00)
Glucose, Bld: 94 mg/dL (ref 70–99)
Potassium: 3.6 meq/L (ref 3.5–5.1)
Sodium: 140 meq/L (ref 135–145)

## 2023-10-28 LAB — POCT MONO (EPSTEIN BARR VIRUS): Mono, POC: POSITIVE — AB

## 2023-10-28 LAB — HEPATIC FUNCTION PANEL
ALT: 13 U/L (ref 0–35)
AST: 12 U/L (ref 0–37)
Albumin: 4.3 g/dL (ref 3.5–5.2)
Alkaline Phosphatase: 46 U/L (ref 39–117)
Bilirubin, Direct: 0.1 mg/dL (ref 0.0–0.3)
Total Bilirubin: 0.6 mg/dL (ref 0.2–1.2)
Total Protein: 6.8 g/dL (ref 6.0–8.3)

## 2023-10-28 LAB — CBC WITH DIFFERENTIAL/PLATELET
Basophils Absolute: 0 10*3/uL (ref 0.0–0.1)
Basophils Relative: 0.7 % (ref 0.0–3.0)
Eosinophils Absolute: 0 10*3/uL (ref 0.0–0.7)
Eosinophils Relative: 0.4 % (ref 0.0–5.0)
HCT: 41.2 % (ref 36.0–46.0)
Hemoglobin: 13.9 g/dL (ref 12.0–15.0)
Lymphocytes Relative: 24.4 % (ref 12.0–46.0)
Lymphs Abs: 1.5 10*3/uL (ref 0.7–4.0)
MCHC: 33.7 g/dL (ref 30.0–36.0)
MCV: 100.4 fL — ABNORMAL HIGH (ref 78.0–100.0)
Monocytes Absolute: 0.4 10*3/uL (ref 0.1–1.0)
Monocytes Relative: 6.6 % (ref 3.0–12.0)
Neutro Abs: 4.3 10*3/uL (ref 1.4–7.7)
Neutrophils Relative %: 67.9 % (ref 43.0–77.0)
Platelets: 412 10*3/uL — ABNORMAL HIGH (ref 150.0–400.0)
RBC: 4.11 Mil/uL (ref 3.87–5.11)
RDW: 13.2 % (ref 11.5–15.5)
WBC: 6.3 10*3/uL (ref 4.0–10.5)

## 2023-10-28 NOTE — Patient Instructions (Addendum)
Follow up as needed or as scheduled Your mono test was positive today Drink LOTS of fluids REST! This will wax and wane- some days you will feel very tired but other days you may feel ok.  Do what you feel able but don't push too hard No contact sports or activities Don't share cups or utensils Tylenol or ibuprofen as needed for pain, sore throat, etc Call with any questions or concerns Hang in there!! Happy Holidays!

## 2023-10-28 NOTE — Progress Notes (Signed)
   Subjective:    Patient ID: Kelly Warren, female    DOB: March 05, 2001, 22 y.o.   MRN: [id number]  HPI Sore throat/fatigue/headache- pt was seen 11/15 with similar sxs.  At the time, urine looked suspicious for UTI so was treated w/ Keflex.  The Keflex made her ill and given her exposure to 'walking PNA' she was switched to Zpack.  They say that her sxs are no better and now she has a rash on face, chest, and back.  Mom reports she is better than 2 weeks ago but continues to have sxs.     Review of Systems For ROS see HPI     Objective:   Physical Exam Vitals reviewed.  Constitutional:      General: She is not in acute distress.    Appearance: She is not ill-appearing.  HENT:     Head: Normocephalic and atraumatic.     Nose: No congestion.     Mouth/Throat:     Tonsils: No tonsillar exudate. 0 on the right. 0 on the left.  Eyes:     Conjunctiva/sclera: Conjunctivae normal.  Cardiovascular:     Rate and Rhythm: Normal rate and regular rhythm.  Abdominal:     General: There is no distension.     Palpations: Abdomen is soft.     Tenderness: There is abdominal tenderness (mild abd TTP but no hepatosplenomegaly noted). There is no guarding.  Musculoskeletal:     Cervical back: Neck supple.  Lymphadenopathy:     Cervical: No cervical adenopathy.  Skin:    General: Skin is warm and dry.     Coloration: Skin is pale.  Neurological:     General: No focal deficit present.     Mental Status: She is alert.           Assessment & Plan:  Mono- new.  POCT positive.  Reviewed dx w/ mom and patient.  Discussed that course will wax and wane- some days feeling better but others feeling worse.  Stressed the need for fluids, rest, eating regularly.  She is to not push herself and only return to work if she feels ready.  Reviewed she is to avoid all contact sports.  Check labs to assess CBC, CMP given abd pain (even though this is likely part of her viral illness).  Pt and mom expressed  understanding and are in agreement w/ plan.

## 2024-03-30 ENCOUNTER — Ambulatory Visit (INDEPENDENT_AMBULATORY_CARE_PROVIDER_SITE_OTHER): Admitting: Family Medicine

## 2024-03-30 ENCOUNTER — Other Ambulatory Visit: Payer: Self-pay | Admitting: Family Medicine

## 2024-03-30 ENCOUNTER — Encounter: Payer: Self-pay | Admitting: Family Medicine

## 2024-03-30 VITALS — BP 110/68 | HR 83 | Temp 97.9°F | Ht 64.0 in | Wt 99.2 lb

## 2024-03-30 DIAGNOSIS — E559 Vitamin D deficiency, unspecified: Secondary | ICD-10-CM

## 2024-03-30 DIAGNOSIS — R195 Other fecal abnormalities: Secondary | ICD-10-CM

## 2024-03-30 DIAGNOSIS — R52 Pain, unspecified: Secondary | ICD-10-CM | POA: Diagnosis not present

## 2024-03-30 DIAGNOSIS — D7589 Other specified diseases of blood and blood-forming organs: Secondary | ICD-10-CM

## 2024-03-30 DIAGNOSIS — J029 Acute pharyngitis, unspecified: Secondary | ICD-10-CM | POA: Diagnosis not present

## 2024-03-30 DIAGNOSIS — R5383 Other fatigue: Secondary | ICD-10-CM

## 2024-03-30 DIAGNOSIS — Z114 Encounter for screening for human immunodeficiency virus [HIV]: Secondary | ICD-10-CM

## 2024-03-30 DIAGNOSIS — Z1159 Encounter for screening for other viral diseases: Secondary | ICD-10-CM

## 2024-03-30 DIAGNOSIS — B2790 Infectious mononucleosis, unspecified without complication: Secondary | ICD-10-CM

## 2024-03-30 LAB — COMPREHENSIVE METABOLIC PANEL WITH GFR
ALT: 16 U/L (ref 0–35)
AST: 14 U/L (ref 0–37)
Albumin: 4.5 g/dL (ref 3.5–5.2)
Alkaline Phosphatase: 32 U/L — ABNORMAL LOW (ref 39–117)
BUN: 12 mg/dL (ref 6–23)
CO2: 26 meq/L (ref 19–32)
Calcium: 9.4 mg/dL (ref 8.4–10.5)
Chloride: 104 meq/L (ref 96–112)
Creatinine, Ser: 0.65 mg/dL (ref 0.40–1.20)
GFR: 122.57 mL/min (ref >60.00)
Glucose, Bld: 88 mg/dL (ref 70–99)
Potassium: 3.6 meq/L (ref 3.5–5.1)
Sodium: 139 meq/L (ref 135–145)
Total Bilirubin: 0.6 mg/dL (ref 0.2–1.2)
Total Protein: 7 g/dL (ref 6.0–8.3)

## 2024-03-30 LAB — CBC WITH DIFFERENTIAL/PLATELET
Basophils Absolute: 0.1 10*3/uL (ref 0.0–0.1)
Basophils Relative: 1.3 % (ref 0.0–3.0)
Eosinophils Absolute: 0 10*3/uL (ref 0.0–0.7)
Eosinophils Relative: 0.9 % (ref 0.0–5.0)
HCT: 42.1 % (ref 36.0–46.0)
Hemoglobin: 14.2 g/dL (ref 12.0–15.0)
Lymphocytes Relative: 32.4 % (ref 12.0–46.0)
Lymphs Abs: 1.5 10*3/uL (ref 0.7–4.0)
MCHC: 33.6 g/dL (ref 30.0–36.0)
MCV: 102.1 fl — ABNORMAL HIGH (ref 78.0–100.0)
Monocytes Absolute: 0.3 10*3/uL (ref 0.1–1.0)
Monocytes Relative: 5.8 % (ref 3.0–12.0)
Neutro Abs: 2.8 10*3/uL (ref 1.4–7.7)
Neutrophils Relative %: 59.6 % (ref 43.0–77.0)
Platelets: 296 10*3/uL (ref 150.0–400.0)
RBC: 4.12 Mil/uL (ref 3.87–5.11)
RDW: 13.5 % (ref 11.5–15.5)
WBC: 4.8 10*3/uL (ref 4.0–10.5)

## 2024-03-30 LAB — TSH: TSH: 0.76 u[IU]/mL (ref 0.35–5.50)

## 2024-03-30 LAB — VITAMIN D 25 HYDROXY (VIT D DEFICIENCY, FRACTURES): VITD: 19.88 ng/mL — ABNORMAL LOW (ref 30.00–100.00)

## 2024-03-30 LAB — IBC + FERRITIN
Ferritin: 43.5 ng/mL (ref 10.0–291.0)
Iron: 184 ug/dL — ABNORMAL HIGH (ref 42–145)
Saturation Ratios: 49 % (ref 20.0–50.0)
TIBC: 375.2 ug/dL (ref 250.0–450.0)
Transferrin: 268 mg/dL (ref 212.0–360.0)

## 2024-03-30 LAB — VITAMIN B12: Vitamin B-12: 281 pg/mL (ref 211–911)

## 2024-03-30 MED ORDER — VITAMIN D (ERGOCALCIFEROL) 1.25 MG (50000 UNIT) PO CAPS: 50000.0000 [IU] | ORAL_CAPSULE | ORAL | 1 refills | Status: DC

## 2024-03-30 NOTE — Progress Notes (Signed)
 Phone [phone] In person visit   Subjective:   Kelly Warren is a 23 y.o. year old very pleasant female patient who presents for/with See problem oriented charting Chief Complaint  Patient presents with   Fatigue    She had mono in November last year and she is feeling the same symptoms as she had before.    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 16 g 6   JUNEL FE 1/20 1-20 MG-MCG tablet Take 1 tablet by mouth daily.     loratadine  (CLARITIN ) 10 MG tablet Take 1 tablet (10 mg total) by mouth daily. 30 tablet 11   pantoprazole  (PROTONIX ) 40 MG tablet TAKE 1 TABLET BY MOUTH EVERY DAY 90 tablet 3   sucralfate  (CARAFATE ) 1 g tablet TAKE 1 TABLET BY MOUTH 4 TIMES A DAY (1/2 HOUR BEFORE MEALS AND AT BEDTIME) Please schedule physical with Dr. Arlene Ben for further refills [phone] 360 tablet 0   No current facility-administered medications for this visit.     Objective:  BP 110/68   Pulse 83   Temp 97.9 F (36.6 C)   Ht 5\' 4"  (1.626 m)   Wt 99 lb 3.2 oz (45 kg)   SpO2 99%   BMI 17.03 kg/m  Gen: NAD, resting comfortably No cervical lymphadenopathy, tympanic membrane's normal, pharynx  with mild drainage but no tonsillar edema or exudate, nasal turbinates normal CV: RRR no murmurs rubs or gallops Lungs: CTAB no crackles, wheeze, rhonchi Ext: no edema Skin: warm, dry     Assessment and Plan   # Fatigue S: Patient was diagnosed with mononucleosis by Dr. Paulla Bossier on 10/28/2023 with positive Monospot.  CBC with macrocytosis, BMP and LFTs largely reassuring  She has noted  fatigue for at least a month that felt similar to level of fatigue from prior mono.  In past due to macrocytosis had mentioned taking B12 or multivitamin with B12- not currently taking. No chest pain. Reflux symptoms better even without carafate  lately - doing more yogurt  Family has noted a stomach illness in recent week or so Since last week noted some increased fatigue even worse than last month, body aches, leg aches, sore throat. Home COVID test negative. Has had some nausea as well. No vomiting. Chills at times. No shortness of breath. She reports some dry cough as well- family/mom report they have not heard her complain of sore throat or cough- biggest issue has been fatigue for last month and some loose stools  Another family member contact was diagnosed with a parasite- had some rectal bleeding- patient has not had that thankfully. They do not live together but share meals etc.  A/P: Fatigue  in 23 year old patient with history of mono in the fall- this could be reactivation- check DNA level of EBV- but we want to rule out other causes so we are updating bloodwork as below-discussed there is a chance iron would not be covered but they prefer to check. If this is reassuring and she starts feeling better can cancel June visit- otherwise we will keep that to check back in.   -Does not appear to be strep throat per exam and then declined strep testing -Encouraged increasing caloric intake/weight gain-this may also help with fatigue  #Vitamin D  deficiency S: Medication: not currently on multivitamin- prior  high dose vitamin D   Last vitamin D  Lab Results  Component Value Date   VD25OH 25.52 (L) 04/21/2020  A/P: not currently on treatment- update vitamin D  with labs  Recommended follow up: Return for as needed for new, worsening, persistent symptoms. Future Appointments  Date Time Provider Department Center  05/05/2024  8:20 AM Almira Jaeger, MD [facility]    Lab/Order associations:   ICD-10-CM   1. Sore throat  J02.9 Epstein Barr Virus DNA, Quant RTPCR    2. Infectious mononucleosis without complication, infectious mononucleosis due to unspecified organism  B27.90 Epstein Barr Virus DNA, Quant RTPCR    3. Body aches  R52 Epstein Barr Virus DNA, Quant RTPCR    4. Fatigue, unspecified type  R53.83 Comprehensive metabolic panel with GFR    CBC with Differential/Platelet    IBC + Ferritin    TSH    5. Macrocytosis  D75.89 Vitamin B12    Folate RBC    6. Loose stools  R19.5 TSH    7. Encounter for hepatitis C screening test for low risk patient  Z11.59 Hepatitis C antibody    8. Screening for HIV (human immunodeficiency virus)  Z11.4 HIV Antibody (routine testing w rflx)    9. Vitamin D  deficiency  E55.9 VITAMIN D  25 Hydroxy (Vit-D Deficiency, Fractures)      Time Spent: 31 minutes of total time (9:22 AM-9:53 AM) was spent on the date of the encounter performing the following actions: chart review prior to seeing the patient, obtaining history, performing a medically necessary exam, counseling on the workup and possible treatment plan, placing orders, and documenting in our EHR.    Return precautions advised.  Clarisa Crooked, MD

## 2024-03-30 NOTE — Patient Instructions (Addendum)
 Fatigue in 23 year old patient with history of mono in the fall- this could be reactivation- check DNA level of EBV- but we want to rule out other causes so we are updating bloodwork. If this is reassuring and she starts feeling better can cancel June visit- otherwise we will keep that to check back in. -we strongly doubt HIV or HCV but as doing bloodwork opted to screen today per universal recommendatoins  Team she is doing lab in room If you have mychart- we will send your results within 3 business days of us  receiving them.  If you do not have mychart- we will call you about results within 5 business days of us  receiving them.  *please also note that you will see labs on mychart as soon as they post. I will later go in and write notes on them- will say "notes from Dr. Arlene Ben"   Recommended follow up: Return for as needed for new, worsening, persistent symptoms.

## 2024-03-31 ENCOUNTER — Encounter: Payer: Self-pay | Admitting: Family Medicine

## 2024-04-01 ENCOUNTER — Encounter: Payer: Self-pay | Admitting: Family Medicine

## 2024-04-01 LAB — EPSTEIN BARR VIRUS DNA, QUANT RTPCR
EBV DNA, QN PCR: NOT DETECTED [IU]/mL
EBV DNA, QN PCR: NOT DETECTED {Log_IU}/mL

## 2024-04-01 LAB — HIV ANTIBODY (ROUTINE TESTING W REFLEX): HIV 1&2 Ab, 4th Generation: NONREACTIVE

## 2024-04-01 LAB — FOLATE RBC: RBC Folate: 502 ng/mL (ref >280)

## 2024-04-01 LAB — HEPATITIS C ANTIBODY: Hepatitis C Ab: NONREACTIVE

## 2024-05-05 ENCOUNTER — Encounter: Payer: BC Managed Care – PPO | Admitting: Family Medicine

## 2024-05-18 ENCOUNTER — Ambulatory Visit: Payer: Self-pay | Admitting: Family Medicine

## 2024-08-17 ENCOUNTER — Ambulatory Visit (INDEPENDENT_AMBULATORY_CARE_PROVIDER_SITE_OTHER): Admitting: Family Medicine

## 2024-08-17 ENCOUNTER — Encounter: Payer: Self-pay | Admitting: Family Medicine

## 2024-08-17 ENCOUNTER — Ambulatory Visit: Payer: Self-pay

## 2024-08-17 VITALS — BP 118/72 | HR 79 | Temp 98.8°F | Ht 64.0 in | Wt 98.4 lb

## 2024-08-17 DIAGNOSIS — R82998 Other abnormal findings in urine: Secondary | ICD-10-CM | POA: Diagnosis not present

## 2024-08-17 DIAGNOSIS — R300 Dysuria: Secondary | ICD-10-CM | POA: Diagnosis not present

## 2024-08-17 DIAGNOSIS — J029 Acute pharyngitis, unspecified: Secondary | ICD-10-CM

## 2024-08-17 DIAGNOSIS — R1084 Generalized abdominal pain: Secondary | ICD-10-CM

## 2024-08-17 LAB — POCT URINALYSIS DIP (CLINITEK)
Bilirubin, UA: NEGATIVE
Glucose, UA: NEGATIVE mg/dL
Ketones, POC UA: NEGATIVE mg/dL
Nitrite, UA: NEGATIVE
POC PROTEIN,UA: NEGATIVE
Spec Grav, UA: 1.02 (ref 1.010–1.025)
Urobilinogen, UA: 0.2 U/dL
pH, UA: 7 (ref 5.0–8.0)

## 2024-08-17 LAB — POC COVID19 BINAXNOW: SARS Coronavirus 2 Ag: NEGATIVE

## 2024-08-17 LAB — POCT INFLUENZA A/B
Influenza A, POC: NEGATIVE
Influenza B, POC: NEGATIVE

## 2024-08-17 LAB — POCT RAPID STREP A (OFFICE): Rapid Strep A Screen: NEGATIVE

## 2024-08-17 MED ORDER — CEPHALEXIN 500 MG PO CAPS
500.0000 mg | ORAL_CAPSULE | Freq: Three times a day (TID) | ORAL | 0 refills | Status: AC
Start: 2024-08-17 — End: 2024-08-24

## 2024-08-17 NOTE — Progress Notes (Signed)
 Phone [phone] In person visit   Subjective:   Kelly Warren is a 23 y.o. year old very pleasant female patient who presents for/with See problem oriented charting Chief Complaint  Patient presents with   Abdominal Pain    Nauseous; upper gastric pain; starting last Sunday; vaginal spotting while on birth control;    Headache   Numbness    Arm and leg numbness that's comes and goes starting last Thursday;    Discussed the use of AI scribe software for clinical note transcription with the patient, who gave verbal consent to proceed.  History of Present Illness   Kelly Warren is a 23 year old female who presents with persistent fatigue, headaches, sore throat, and abdominal pain.  Patient seen here 03/30/24-She had been experiencing persistent fatigue for at least a month at that time, reminiscent of her previous mononucleosis in November 2024. During that episode, she also had a stomach illness with nausea but no vomiting. A home COVID test was negative, and Epstein-Barr virus DNA levels did not indicate reactivation. There has been some improvement in fatigue but not complete resolution.  She was evaluated by a gynecologist on Apr 29, 2024, for dysmenorrhea and was advised to continue oral contraceptive pills continuously for three months. She recalls that naproxen was recommended for use during her menstrual cycles. She continues to experience intermittent abdominal cramping and spotting outside the three-month cycle, which she mentioned to her gynecologist but she still prefers 3 month cycles  Recently, she developed new symptoms starting Thursday, including headaches, sore throat, epigastric pain, and numbness and tingling in her legs and arms. The headaches are mild but affect her focus, with occasional nausea. The sore throat has worsened since her hand, foot, and mouth disease in early August, contracted during a family vacation. She also experiences a dry cough, chills, and body  aches, but no fever or significant ear pain.  She takes vitamin D  50,000 units and B12 supplements due to previously identified low vitamin D  and low-normal B12 levels. Her iron levels were good, and other tests, including thyroid function and mono, were normal. She has some tingling in her arms and legs intermittently just since illness.   She works at [employer] Monday through Thursday, and her symptoms, particularly headaches, worsen with physical activity at work. She has not been aware of any recent illnesses around her workplace.       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 19 capsule 0   fluticasone  (FLONASE ) 50 MCG/ACT nasal spray Place 2 sprays into both nostrils daily. 16 g 6   JUNEL FE 1/20 1-20 MG-MCG  tablet Take 1 tablet by mouth daily.     loratadine  (CLARITIN ) 10 MG tablet Take 1 tablet (10 mg total) by mouth daily. 30 tablet 11   pantoprazole  (PROTONIX ) 40 MG tablet TAKE 1 TABLET BY MOUTH EVERY DAY 90 tablet 3   sucralfate  (CARAFATE ) 1 g tablet TAKE 1 TABLET BY MOUTH 4 TIMES A DAY (1/2 HOUR BEFORE MEALS AND AT BEDTIME) Please schedule physical with Dr. Katrinka for  further refills [phone] 360 tablet 0   Vitamin D , Ergocalciferol , (DRISDOL ) 1.25 MG (50000 UNIT) CAPS capsule Take 1 capsule (50,000 Units total) by mouth every 7 (seven) days. 13 capsule 1   No current facility-administered medications for this visit.     Objective:  BP 118/72 (BP Location: Left Arm, Patient Position: Sitting, Cuff Size: Normal)   Pulse 79   Temp 98.8 F (37.1 C) (Temporal)   Ht 5' 4 (1.626 m)   Wt 98 lb 6.4 oz (44.6 kg)   LMP 08/10/2024 (Approximate)   SpO2 97%   BMI 16.89 kg/m  Gen: NAD, resting comfortably Tympanic membrane normal bilaterally, some drainage and pharynx but otherwise oropharynx normal, nasal turbinates mildly erythematous with some yellow discharge.  No reported sinus pressure CV: RRR no murmurs rubs or gallops Lungs: CTAB no crackles, wheeze, rhonchi Abdomen: soft/nontender except for epigastric abdominal pain mild in nature/nondistended/normal bowel sounds. No rebound or guarding.  Ext: no edema Skin: warm, dry  Neuro: CN II-XII intact, sensation and reflexes normal throughout-she reports difficulty with focus with eye movements, 5/5 muscle strength in bilateral upper and lower extremities. Normal finger to nose. Normal rapid alternating movements. No pronator drift. Normal romberg. Normal gait.    Results for orders placed or performed in visit on 08/17/24 (from the past 24 hours)  POCT Influenza A/B     Status: None   Collection Time: 08/17/24 11:26 AM  Result Value Ref Range   Influenza A, POC Negative Negative   Influenza B, POC Negative Negative  POCT rapid strep A     Status: None   Collection Time: 08/17/24 11:26 AM  Result Value Ref Range   Rapid Strep A Screen Negative Negative  POC COVID-19     Status: None   Collection Time: 08/17/24 11:27 AM  Result Value Ref Range   SARS Coronavirus 2 Ag Negative Negative  POCT URINALYSIS DIP (CLINITEK)     Status: Abnormal   Collection Time: 08/17/24 11:40 AM  Result Value Ref Range    Color, UA straw (A) yellow   Clarity, UA cloudy (A) clear   Glucose, UA negative negative mg/dL   Bilirubin, UA negative negative   Ketones, POC UA negative negative mg/dL   Spec Grav, UA 8.979 8.989 - 1.025   Blood, UA trace-intact (A) negative   pH, UA 7.0 5.0 - 8.0   POC PROTEIN,UA negative negative, trace   Urobilinogen, UA 0.2 0.2 or 1.0 E.U./dL   Nitrite, UA Negative Negative   Leukocytes, UA Trace (A) Negative       Assessment and Plan      # Urinary tract infection Possible urinary tract infection with symptoms of epigastric pain, nausea.  No current urinary symptoms but has already had 2 doses of cephalexin  from her mother from a prior illness.  Intermittent spotting outside of the three-month cycle of oral contraceptive use that GYN is aware of-likely the cause of trace blood in the urine-she reports recently had spotting. The differential includes gastrointestinal issues, but the decision was made to treat  for UTI first and consultation with her and her family.  She does not like this is like her typical reflux and wants to hold off on reflux medicine for now unless she fails to improve - Prescribe cephalexin  500 mg, three times a day, adjusting for previous doses taken. - Monitor symptoms and if not improved, consider referral to gastroenterology and re-evaluate with blood work. -I think many of her symptoms may honestly be more related to viral upper respiratory infection-see below  # Viral upper respiratory infection Symptoms include mild headaches, sore throat, and epigastric pain, which started on Thursday.  She had some lingering sore throat from a recent hand, foot, and mouth disease illness but this further worsened it. Examination revealed some left cervical lymph node enlargement, suggesting a viral etiology. Differential includes strep throat, flu, and COVID-19. - Perform strep, flu, and COVID-19 tests.  These were negative today - Encourage hydration and  rest.  # Fatigue with paresthesia of extremities Persistent fatigue with numbness and tingling in the legs and arms. Previous blood work showed low vitamin D  and low-normal B12, which can contribute to these symptoms. Neurological exam was normal. - Continue high-dose vitamin D  supplementation. - Continue vitamin B12 supplementation.  -Patient can also sometimes have atypical symptoms with illness-epigastric abdominal pain for instance is a common thread and multiple of her reports with prior illness that seems to resolve with treatment and wonder if that also goes the same for the paresthesia    Recommended follow up: Return for as needed for new, worsening, persistent symptoms. No future appointments.  Lab/Order associations:   ICD-10-CM   1. Sore throat  J02.9 POCT Influenza A/B    POC COVID-19    POCT rapid strep A    2. Generalized abdominal pain  R10.84 POCT URINALYSIS DIP (CLINITEK)    3. Dysuria  R30.0 POCT URINALYSIS DIP (CLINITEK)    4. Leukocytes in urine  R82.998 Urine Culture      Meds ordered this encounter  Medications   cephALEXin  (KEFLEX ) 500 MG capsule    Sig: Take 1 capsule (500 mg total) by mouth 3 (three) times daily for 7 days.    Dispense:  19 capsule    Refill:  0   I personally spent a total of 30 minutes in the care of the patient today including preparing to see the patient including reviewing recent notes, getting/reviewing separately obtained history, performing a medically appropriate exam/evaluation, counseling and educating-to be about potential causes and potential workup and consulting about possible gastroenterology referral if fails to improve as well as timing of this and her preference to try to treat UTI first, placing orders, and documenting clinical information in the EHR.  Return precautions advised.  Garnette Lukes, MD

## 2024-08-17 NOTE — Telephone Encounter (Signed)
 FYI Only or Action Required?: Action required by provider: request for appointment.  Patient was last seen in primary care on 03/30/2024 by Katrinka Garnette KIDD, MD.  Called Nurse Triage reporting Abdominal Pain.  Symptoms began several days ago.  Interventions attempted: OTC medications: monostat, Keflex .  Symptoms are: unchanged.  Triage Disposition: See HCP Within 4 Hours (Or PCP Triage)  Patient/caregiver understands and will follow disposition?: YesCopied from CRM #[id number]. Topic: Clinical - Red Word Triage >> Aug 17, 2024  8:58 AM Ivette P wrote: Red Word that prompted transfer to Nurse Triage: abdominal pain, bleeding, might be having another period. more symptoms after she has eating something. symptoms. Reason for Disposition  [1] MILD-MODERATE pain AND [2] constant AND [3] present > 2 hours  Answer Assessment - Initial Assessment Questions Pt has taken some off Keflex  (2 tabs) for possible UTI, OTC Monistat (3x doses)  for yeast infection. Mom says symptoms are all over the place.      1. LOCATION: Where does it hurt?      Top and lower abd 2. RADIATION: Does the pain shoot anywhere else? (e.g., chest, back)     Sometimes to arms and legs 3. ONSET: When did the pain begin? (e.g., minutes, hours or days ago)      Thursday  4. SUDDEN: Gradual or sudden onset?     sudden 5. PATTERN Does the pain come and go, or is it constant?     comes 6. SEVERITY: How bad is the pain?  (e.g., Scale 1-10; mild, moderate, or severe)     5 7. RECURRENT SYMPTOM: Have you ever had this type of stomach pain before? If Yes, ask: When was the last time? and What happened that time?      na 8. CAUSE: What do you think is causing the stomach pain? (e.g., gallstones, recent abdominal surgery)     Not sure 9. RELIEVING/AGGRAVATING FACTORS: What makes it better or worse? (e.g., antacids, bending or twisting motion, bowel movement)     Heating pad helps 10. OTHER SYMPTOMS:  Do you have any other symptoms? (e.g., back pain, diarrhea, fever, urination pain, vomiting)       Nausea, burns when urinating  11. PREGNANCY: Is there any chance you are pregnant? When was your last menstrual period?       denies  Protocols used: Abdominal Pain - [facility]

## 2024-08-17 NOTE — Telephone Encounter (Signed)
 Please see triage note. Scheduled to see you today.

## 2024-08-17 NOTE — Patient Instructions (Addendum)
 UA concerning for possible UTI. Will get culture- may be influenced by recent keflex . Empiric treatment with: keflex  500 mg three times daily for 7 days Patient to follow up if new or worsening symptoms or failure to improve.   Hope to return to work on Wednesday  If fail to improve we discussed trial of reflux medicine and referral to gastroenterology as well as labs- you mychart me about that and we can set that up  Recommended follow up: Return for as needed for new, worsening, persistent symptoms.

## 2024-08-17 NOTE — Telephone Encounter (Signed)
Seen and addressed today

## 2024-08-18 LAB — URINE CULTURE
MICRO NUMBER:: 16968354
Result:: NO GROWTH
SPECIMEN QUALITY:: ADEQUATE

## 2024-08-19 ENCOUNTER — Ambulatory Visit: Payer: Self-pay | Admitting: Family Medicine

## 2024-09-15 ENCOUNTER — Other Ambulatory Visit: Payer: Self-pay

## 2024-09-15 MED ORDER — PANTOPRAZOLE SODIUM 40 MG PO TBEC: 40.0000 mg | DELAYED_RELEASE_TABLET | Freq: Every day | ORAL | 3 refills | Status: AC

## 2024-09-26 ENCOUNTER — Other Ambulatory Visit: Payer: Self-pay | Admitting: Family Medicine

## 2024-10-22 ENCOUNTER — Other Ambulatory Visit (HOSPITAL_BASED_OUTPATIENT_CLINIC_OR_DEPARTMENT_OTHER): Payer: Self-pay

## 2024-10-22 MED ORDER — FLUZONE 0.5 ML IM SUSY
0.5000 mL | PREFILLED_SYRINGE | Freq: Once | INTRAMUSCULAR | 0 refills | Status: AC
  Filled 2024-10-22: qty 0.5, 1d supply, fill #0
